# Patient Record
Sex: Male | Born: 1940 | Race: White | Hispanic: No | Smoking: Former smoker
Health system: Southern US, Community
[De-identification: ages and names within clinical notes are randomized; demographics above are authoritative.]

## PROBLEM LIST (undated history)

## (undated) DIAGNOSIS — C801 Malignant (primary) neoplasm, unspecified: Secondary | ICD-10-CM

## (undated) DIAGNOSIS — G8929 Other chronic pain: Secondary | ICD-10-CM

## (undated) DIAGNOSIS — K219 Gastro-esophageal reflux disease without esophagitis: Secondary | ICD-10-CM

## (undated) DIAGNOSIS — M549 Dorsalgia, unspecified: Secondary | ICD-10-CM

## (undated) DIAGNOSIS — I10 Essential (primary) hypertension: Secondary | ICD-10-CM

## (undated) DIAGNOSIS — J449 Chronic obstructive pulmonary disease, unspecified: Secondary | ICD-10-CM

## (undated) DIAGNOSIS — F1010 Alcohol abuse, uncomplicated: Secondary | ICD-10-CM

## (undated) DIAGNOSIS — E7800 Pure hypercholesterolemia, unspecified: Secondary | ICD-10-CM

## (undated) DIAGNOSIS — K2270 Barrett's esophagus without dysplasia: Secondary | ICD-10-CM

## (undated) DIAGNOSIS — G4730 Sleep apnea, unspecified: Secondary | ICD-10-CM

## (undated) DIAGNOSIS — F419 Anxiety disorder, unspecified: Secondary | ICD-10-CM

## (undated) HISTORY — PX: LASIK: SHX215

## (undated) HISTORY — PX: GANGLION CYST EXCISION: SHX1691

## (undated) HISTORY — PX: APPENDECTOMY: SHX54

## (undated) HISTORY — PX: EYE SURGERY: SHX253

## (undated) HISTORY — PX: CARDIAC CATHETERIZATION: SHX172

## (undated) HISTORY — PX: COLONOSCOPY: SHX174

## (undated) HISTORY — PX: TONSILLECTOMY: SUR1361

## (undated) HISTORY — PX: PROSTATECTOMY: SHX69

---

## 2018-01-29 DIAGNOSIS — E871 Hypo-osmolality and hyponatremia: Secondary | ICD-10-CM | POA: Insufficient documentation

## 2018-01-29 DIAGNOSIS — I517 Cardiomegaly: Secondary | ICD-10-CM | POA: Insufficient documentation

## 2018-01-29 DIAGNOSIS — R079 Chest pain, unspecified: Secondary | ICD-10-CM | POA: Insufficient documentation

## 2018-01-29 DIAGNOSIS — R9431 Abnormal electrocardiogram [ECG] [EKG]: Secondary | ICD-10-CM | POA: Insufficient documentation

## 2018-04-18 ENCOUNTER — Encounter (INDEPENDENT_AMBULATORY_CARE_PROVIDER_SITE_OTHER): Payer: Self-pay | Admitting: Orthopaedic Surgery

## 2018-04-18 ENCOUNTER — Ambulatory Visit (INDEPENDENT_AMBULATORY_CARE_PROVIDER_SITE_OTHER): Payer: Medicare Other | Admitting: Orthopaedic Surgery

## 2018-04-18 DIAGNOSIS — G8929 Other chronic pain: Secondary | ICD-10-CM | POA: Diagnosis not present

## 2018-04-18 DIAGNOSIS — M545 Low back pain, unspecified: Secondary | ICD-10-CM | POA: Insufficient documentation

## 2018-04-18 NOTE — Progress Notes (Signed)
Office Visit Note   Patient: Ricky Zavala           Date of Birth: [birth year]           MRN: [id number] Visit Date: 04/18/2018              Requested by: No referring provider defined for this encounter. PCP: Egbert Garibaldi, PA-C   Assessment & Plan: Visit Diagnoses:  1. Chronic bilateral low back pain without sciatica     Plan: I do feel that his back pain is more from stenosis and facet joint arthritis.  I do feel he would benefit from steroid injections in the facet joints likely at L4-L5 bilaterally but we need to obtain an MRI to best determine the level of where an intervention can help him the most.  In the meantime I will put him on a Medrol Dosepak while we await the MRI.  All questions concerns were answered and addressed.  Follow-Up Instructions: Return in about 2 weeks (around 05/02/2018).   Orders:  No orders of the defined types were placed in this encounter.  No orders of the defined types were placed in this encounter.     Procedures: No procedures performed   Clinical Data: No additional findings.   Subjective: Chief Complaint  Patient presents with  . Left Hip - Pain  The patient is someone I am seeing for the first time.  He actually comes in for evaluation of chronic low back pain.  He is actually seen a chiropractor for years now and has adjustments quite a bit.  He denies any groin pain.  Denies any radicular symptoms.  He denies any weakness in his legs.  He has had back pain for 4 years now.  He takes meloxicam on occasion to help with his pain.  Is gotten to where it hurts quite a bit on a daily basis across the lower aspect of his lumbar spine is where he points to.  He denies any change in bowel bladder function.  He is not a diabetic and not a smoker.  He currently denies any headache, chest pain, shortness of breath, fever, chills, nausea, vomiting.  HPI  Review of Systems See above  Objective: Vital Signs: There were no vitals taken for this  visit.  Physical Exam Is alert and oriented x3 and in no acute distress examination of his lumbar spine shows limited flexion extension with more extension and flexion.  His hamstrings are very tight. Ortho Exam He has 5 out of 5 strength of his bilateral lower extremities and normal sensation in his legs.  He hurts to the lower aspect of his lumbar spine to palpation of the paraspinal muscles in the midline. Specialty Comments:  No specialty comments available.  Imaging: No results found. X-rays that accompany him of the lumbar spine show no acute findings.  The lateral view does show a normal lordosis with some osteophytes at several levels and slight disc space narrowing.  PMFS History: Patient Active Problem List   Diagnosis Date Noted  . Chronic bilateral low back pain without sciatica 04/18/2018   History reviewed. No pertinent past medical history.  History reviewed. No pertinent family history.  History reviewed. No pertinent surgical history. Social History   Occupational History  . Not on file  Tobacco Use  . Smoking status: Not on file  Substance and Sexual Activity  . Alcohol use: Not on file  . Drug use: Not on file  . Sexual activity: Not  on file

## 2018-04-20 ENCOUNTER — Other Ambulatory Visit (INDEPENDENT_AMBULATORY_CARE_PROVIDER_SITE_OTHER): Payer: Self-pay

## 2018-04-20 ENCOUNTER — Telehealth (INDEPENDENT_AMBULATORY_CARE_PROVIDER_SITE_OTHER): Payer: Self-pay | Admitting: Orthopaedic Surgery

## 2018-04-20 DIAGNOSIS — M4807 Spinal stenosis, lumbosacral region: Secondary | ICD-10-CM

## 2018-04-20 MED ORDER — METHYLPREDNISOLONE 4 MG PO TABS: ORAL_TABLET | ORAL | 0 refills | Status: DC

## 2018-04-20 NOTE — Telephone Encounter (Signed)
Called in for patient.

## 2018-04-20 NOTE — Telephone Encounter (Signed)
Patient called and stated that he was told at his appt on 7/31 they would call in some steroids. There hasn't been any called in thus far.  Per visit notes:  I do feel he would benefit from steroid injections in the facet joints likely at L4-L5 bilaterally but we need to obtain an MRI to best determine the level of where an intervention can help him the most.  In the meantime I will put him on a Medrol Dosepak while we await the MRI.   Please call patient to advise. [phone]

## 2018-05-01 ENCOUNTER — Ambulatory Visit (INDEPENDENT_AMBULATORY_CARE_PROVIDER_SITE_OTHER): Payer: Medicare Other | Admitting: Physician Assistant

## 2018-05-02 ENCOUNTER — Other Ambulatory Visit: Payer: Self-pay

## 2018-05-02 ENCOUNTER — Ambulatory Visit
Admission: RE | Admit: 2018-05-02 | Discharge: 2018-05-02 | Disposition: A | Discharge status: Home / Self Care | Payer: Medicare Other | Source: Ambulatory Visit | Attending: Orthopaedic Surgery | Admitting: Orthopaedic Surgery

## 2018-05-02 DIAGNOSIS — M4807 Spinal stenosis, lumbosacral region: Secondary | ICD-10-CM

## 2018-05-03 ENCOUNTER — Ambulatory Visit (INDEPENDENT_AMBULATORY_CARE_PROVIDER_SITE_OTHER): Payer: Medicare Other | Admitting: Physician Assistant

## 2018-05-03 ENCOUNTER — Encounter (INDEPENDENT_AMBULATORY_CARE_PROVIDER_SITE_OTHER): Payer: Self-pay | Admitting: Physician Assistant

## 2018-05-03 DIAGNOSIS — M545 Low back pain, unspecified: Secondary | ICD-10-CM

## 2018-05-03 NOTE — Progress Notes (Signed)
Office Visit Note   Patient: Ricky Zavala           Date of Birth: 08/15/1941           MRN: [id number] Visit Date: 05/03/2018              Requested by: Egbert Garibaldi, PA-C [address] [place], [place] [address] PCP: Egbert Garibaldi, PA-C   Assessment & Plan: Visit Diagnoses: No diagnosis found.  Plan: We will send patient for lumbar epidural steroid injection versus facet injections Dr. Ernestina Patches.  Also send him to physical therapy for home exercise program to strengthen his core and also for stretching hamstrings gastrocsoleus.  Follow-Up Instructions: Return for 4 weeks after ESI.   Orders:  No orders of the defined types were placed in this encounter.  No orders of the defined types were placed in this encounter.     Procedures: No procedures performed   Clinical Data: No additional findings.   Subjective: Chief Complaint  Patient presents with  . Lower Back - Follow-up    HPI Mr. Overholser returns today to go over the MRI of his lumbar spine.  He denies any numbness tingling pain down either leg.  He just has low back pain worse with prolonged standing.  No waking pain.  He notes he has plantar fasciitis which he is treated for in [facility] by podiatrist.  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  This contributes to mild to moderate bilateral foraminal stenosis.  Spondylosis at L3-4 and L4-5 resulting in borderline bilateral subarticular recess stenosis.  Review of Systems See HPI otherwise negative  Objective: Vital Signs: There were no vitals taken for this visit.  Physical Exam  Constitutional: He is oriented to person, place, and time. He appears well-developed and well-nourished. No distress.  Pulmonary/Chest: Effort normal.  Neurological: He is alert and oriented to person, place, and time.  Skin: He is not diaphoretic.    Ortho Exam Negative straight leg raise  bilaterally.  Exquisitely tight hamstrings bilaterally.  Tight gastroc on the right. Specialty Comments:  No specialty comments available.  Imaging: Mr Lumbar Spine W/o Contrast  Result Date: 05/02/2018 CLINICAL DATA:  Low back pain and bilateral leg pain and weakness. Bilateral hip pain. EXAM: MRI LUMBAR SPINE WITHOUT CONTRAST TECHNIQUE: Multiplanar, multisequence MR imaging of the lumbar spine was performed. No intravenous contrast was administered. COMPARISON:  None. FINDINGS: Segmentation: The lowest lumbar type non-rib-bearing vertebra is labeled as L5. Alignment: 7 mm of anterolisthesis at L5-S1 associated with chronic bilateral pars defects. 4 mm degenerative retrolisthesis at L2-3 with 2 mm degenerative retrolisthesis L3-4. Vertebrae: Marrow heterogeneity is present. Although this can be caused by marrow infiltrative processes, the most common causes include anemia, smoking, obesity, or advancing age. 1.5 cm in diameter hemangioma in the T12 vertebral body. Type 1 degenerative endplate findings along the left inferior endplate of L4. Type 2 degenerative endplate findings at L4-T6. Conus medullaris and cauda equina: Conus extends to the L1 level. Conus and cauda equina appear normal. Paraspinal and other soft tissues: Fluid signal intensity lesions of the left kidney including what is probably a larger lesion along the lower pole. These may well be cysts better technically nonspecific and only partially included in imaging. No pathologic retroperitoneal adenopathy is observed. No appreciable abdominal aortic aneurysm. Disc levels: L1-2: No impingement.  Mild disc bulge. L2-3: No impingement.  Diffuse disc  bulge. L3-4: Borderline bilateral subarticular lateral recess stenosis due to disc bulge and facet arthropathy. L4-5: Borderline right subarticular lateral recess stenosis due to disc bulge. L5-S1: Mild to moderate bilateral foraminal stenosis due to disc uncovering and subluxation along with mild  facet arthropathy. This is slightly worse on the right compared to the left. IMPRESSION: 1. 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 2. Borderline impingement at L3-4 and L4-5 due to spondylosis and degenerative disc disease. 3. Marrow heterogeneity is present. Although this can be caused by marrow infiltrative processes, the most common causes include anemia, smoking, obesity, or advancing age. 4. We image the margin of several fluid signal intensity lesions in the vicinity of the left kidney. These are probably cysts but only a small minority of the volume of these lesions is included on today's imaging. Electronically Signed   By: Van Clines M.D.   On: 05/02/2018 16:55     PMFS History: Patient Active Problem List   Diagnosis Date Noted  . Chronic bilateral low back pain without sciatica 04/18/2018   No past medical history on file.  No family history on file.  No past surgical history on file. Social History   Occupational History  . Not on file  Tobacco Use  . Smoking status: Not on file  Substance and Sexual Activity  . Alcohol use: Not on file  . Drug use: Not on file  . Sexual activity: Not on file

## 2018-05-04 ENCOUNTER — Other Ambulatory Visit (INDEPENDENT_AMBULATORY_CARE_PROVIDER_SITE_OTHER): Payer: Self-pay

## 2018-05-04 DIAGNOSIS — G8929 Other chronic pain: Secondary | ICD-10-CM

## 2018-05-04 DIAGNOSIS — M545 Low back pain, unspecified: Secondary | ICD-10-CM

## 2018-05-07 DIAGNOSIS — R0609 Other forms of dyspnea: Secondary | ICD-10-CM | POA: Insufficient documentation

## 2018-05-25 ENCOUNTER — Ambulatory Visit (INDEPENDENT_AMBULATORY_CARE_PROVIDER_SITE_OTHER): Payer: Medicare Other | Admitting: Physical Medicine and Rehabilitation

## 2018-05-25 ENCOUNTER — Ambulatory Visit (INDEPENDENT_AMBULATORY_CARE_PROVIDER_SITE_OTHER): Payer: Self-pay

## 2018-05-25 ENCOUNTER — Encounter (INDEPENDENT_AMBULATORY_CARE_PROVIDER_SITE_OTHER): Payer: Self-pay | Admitting: Physical Medicine and Rehabilitation

## 2018-05-25 VITALS — BP 129/63 | HR 64

## 2018-05-25 DIAGNOSIS — M47816 Spondylosis without myelopathy or radiculopathy, lumbar region: Secondary | ICD-10-CM | POA: Diagnosis not present

## 2018-05-25 MED ORDER — METHYLPREDNISOLONE ACETATE 80 MG/ML IJ SUSP
80.0000 mg | Freq: Once | INTRAMUSCULAR | Status: AC
  Administered 2018-05-25: 80 mg

## 2018-05-25 NOTE — Patient Instructions (Signed)

## 2018-05-25 NOTE — Progress Notes (Signed)
 .  Numeric Pain Rating Scale and Functional Assessment Average Pain 6   In the last MONTH (on 0-10 scale) has pain interfered with the following?  1. General activity like being  able to carry out your everyday physical activities such as walking, climbing stairs, carrying groceries, or moving a chair?  Rating(4)   +Driver, -BT, -Dye Allergies.  

## 2018-06-13 NOTE — Progress Notes (Addendum)
Ricky Zavala - 77 y.o. male MRN [id number]  Date of birth: 1941-02-16  Office Visit Note: Visit Date: 05/25/2018 PCP: Ricky Garibaldi, PA-C Referred by: Ricky Garibaldi, PA-C  Subjective: Chief Complaint  Patient presents with  . Lower Back - Pain  . Right Hip - Pain  . Left Hip - Pain   HPI: Mr. Ricky Zavala is a 77 year old gentleman with chronic worsening bilateral lower back pain with some referral to the lateral hips.  He has no radicular pain or paresthesia.  He is been followed closely by Dr. Jean Zavala and Ricky Zavala, P.A.-C.  After failure of conservative care with medication management and activity modification MRI of the lower back was performed.  This does show facet arthropathy as well as pars defects at L5-S1 and listhesis.  Patient has some by foraminal narrowing but no central canal stenosis.  After discussion with him we are going to complete bilateral L4-5 and L5-S1 facet joint blocks.  If he does well with that we would consider second diagnostic block and possibly radiofrequency ablation.  He was also started with physical therapy for core strengthening which is very appropriate.  **ADDENDUM Facet level blocked were L3-4 and L4-5   ROS Otherwise per HPI.  Assessment & Plan: Visit Diagnoses:  1. Spondylosis without myelopathy or radiculopathy, lumbar region     Plan: No additional findings.   Meds & Orders:  Meds ordered this encounter  Medications  . methylPREDNISolone acetate (DEPO-MEDROL) injection 80 mg    Orders Placed This Encounter  Procedures  . Facet Injection  . XR C-ARM NO REPORT    Follow-up: Return if symptoms worsen or fail to improve.   Procedures: No procedures performed  Lumbar Facet Joint Intra-Articular Injection(s) with Fluoroscopic Guidance  Patient: Ricky Zavala      Date of Birth: 1940-09-30 MRN: [id number] PCP: Ricky Garibaldi, PA-C      Visit Date: 05/25/2018   Universal Protocol:    Date/Time: 05/25/2018  Consent Given By: the  patient  Position: PRONE   Additional Comments: Vital signs were monitored before and after the procedure. Patient was prepped and draped in the usual sterile fashion. The correct patient, procedure, and site was verified.   Injection Procedure Details:  Procedure Site One Meds Administered:  Meds ordered this encounter  Medications  . methylPREDNISolone acetate (DEPO-MEDROL) injection 80 mg     Laterality: Bilateral  Location/Site:  L4-L5 L5-S1  Needle size: 22 guage  Needle type: Spinal  Needle Placement: Articular  Findings:  -Comments: Excellent flow of contrast producing a partial arthrogram.  Procedure Details: The fluoroscope beam is vertically oriented in AP, and the inferior recess is visualized beneath the lower pole of the inferior apophyseal process, which represents the target point for needle insertion. When direct visualization is difficult the target point is located at the medial projection of the vertebral pedicle. The region overlying each aforementioned target is locally anesthetized with a 1 to 2 ml. volume of 1% Lidocaine without Epinephrine.   The spinal needle was inserted into each of the above mentioned facet joints using biplanar fluoroscopic guidance. A 0.25 to 0.5 ml. volume of Isovue-250 was injected and a partial facet joint arthrogram was obtained. A single spot film was obtained of the resulting arthrogram.    One to 1.25 ml of the steroid/anesthetic solution was then injected into each of the facet joints noted above.   Additional Comments:  The patient tolerated the procedure well Dressing: Band-Aid    Post-procedure details: Patient  was observed during the procedure. Post-procedure instructions were reviewed.  Patient left the clinic in stable condition.    Clinical History: MRI LUMBAR SPINE WITHOUT CONTRAST  TECHNIQUE: Multiplanar, multisequence MR imaging of the lumbar spine was performed. No intravenous contrast was  administered.  COMPARISON:  None.  FINDINGS: Segmentation: The lowest lumbar type non-rib-bearing vertebra is labeled as L5.  Alignment: 7 mm of anterolisthesis at L5-S1 associated with chronic bilateral pars defects.  4 mm degenerative retrolisthesis at L2-3 with 2 mm degenerative retrolisthesis L3-4.  Vertebrae: Marrow heterogeneity is present. Although this can be caused by marrow infiltrative processes, the most common causes include anemia, smoking, obesity, or advancing age.  1.5 cm in diameter hemangioma in the T12 vertebral body.  Type 1 degenerative endplate findings along the left inferior endplate of L4. Type 2 degenerative endplate findings at D9-I3.  Conus medullaris and cauda equina: Conus extends to the L1 level. Conus and cauda equina appear normal.  Paraspinal and other soft tissues: Fluid signal intensity lesions of the left kidney including what is probably a larger lesion along the lower pole. These may well be cysts better technically nonspecific and only partially included in imaging.  No pathologic retroperitoneal adenopathy is observed. No appreciable abdominal aortic aneurysm.  Disc levels:  L1-2: No impingement.  Mild disc bulge.  L2-3: No impingement.  Diffuse disc bulge.  L3-4: Borderline bilateral subarticular lateral recess stenosis due to disc bulge and facet arthropathy.  L4-5: Borderline right subarticular lateral recess stenosis due to disc bulge.  L5-S1: Mild to moderate bilateral foraminal stenosis due to disc uncovering and subluxation along with mild facet arthropathy. This is slightly worse on the right compared to the left.  IMPRESSION: 1. 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 2. Borderline impingement at L3-4 and L4-5 due to spondylosis and degenerative disc  disease. 3. Marrow heterogeneity is present. Although this can be caused by marrow infiltrative processes, the most common causes include anemia, smoking, obesity, or advancing age. 4. We image the margin of several fluid signal intensity lesions in the vicinity of the left kidney. These are probably cysts but only a small minority of the volume of these lesions is included on today's imaging.   Electronically Signed   By: Van Clines M.D.   On: 05/02/2018 16:55   He has no tobacco history on file. No results for input(s): HGBA1C, LABURIC in the last 8760 hours.  Objective:  VS:  HT:    WT:   BMI:     BP:129/63  HR:64bpm  TEMP: ( )  RESP:  Physical Exam  Ortho Exam Imaging: No results found.  Past Medical/Family/Surgical/Social History: Medications & Allergies reviewed per EMR, new medications updated. Patient Active Problem List   Diagnosis Date Noted  . Chronic bilateral low back pain without sciatica 04/18/2018   History reviewed. No pertinent past medical history. History reviewed. No pertinent family history. History reviewed. No pertinent surgical history. Social History   Occupational History  . Not on file  Tobacco Use  . Smoking status: Not on file  Substance and Sexual Activity  . Alcohol use: Not on file  . Drug use: Not on file  . Sexual activity: Not on file

## 2018-06-13 NOTE — Procedures (Signed)
Lumbar Facet Joint Intra-Articular Injection(s) with Fluoroscopic Guidance  Patient: Ricky Zavala      Date of Birth: [birth year] MRN: [id number] PCP: Egbert Garibaldi, PA-C      Visit Date: 05/25/2018   Universal Protocol:    Date/Time: 05/25/2018  Consent Given By: the patient  Position: PRONE   Additional Comments: Vital signs were monitored before and after the procedure. Patient was prepped and draped in the usual sterile fashion. The correct patient, procedure, and site was verified.   Injection Procedure Details:  Procedure Site One Meds Administered:  Meds ordered this encounter  Medications  . methylPREDNISolone acetate (DEPO-MEDROL) injection 80 mg     Laterality: Bilateral  Location/Site:  L4-L5 L5-S1  Needle size: 22 guage  Needle type: Spinal  Needle Placement: Articular  Findings:  -Comments: Excellent flow of contrast producing a partial arthrogram.  Procedure Details: The fluoroscope beam is vertically oriented in AP, and the inferior recess is visualized beneath the lower pole of the inferior apophyseal process, which represents the target point for needle insertion. When direct visualization is difficult the target point is located at the medial projection of the vertebral pedicle. The region overlying each aforementioned target is locally anesthetized with a 1 to 2 ml. volume of 1% Lidocaine without Epinephrine.   The spinal needle was inserted into each of the above mentioned facet joints using biplanar fluoroscopic guidance. A 0.25 to 0.5 ml. volume of Isovue-250 was injected and a partial facet joint arthrogram was obtained. A single spot film was obtained of the resulting arthrogram.    One to 1.25 ml of the steroid/anesthetic solution was then injected into each of the facet joints noted above.   Additional Comments:  The patient tolerated the procedure well Dressing: Band-Aid    Post-procedure details: Patient was observed during the  procedure. Post-procedure instructions were reviewed.  Patient left the clinic in stable condition.

## 2018-06-19 ENCOUNTER — Ambulatory Visit (INDEPENDENT_AMBULATORY_CARE_PROVIDER_SITE_OTHER): Payer: Medicare Other | Admitting: Physical Medicine and Rehabilitation

## 2018-06-19 ENCOUNTER — Encounter (INDEPENDENT_AMBULATORY_CARE_PROVIDER_SITE_OTHER): Payer: Self-pay | Admitting: Physical Medicine and Rehabilitation

## 2018-06-19 VITALS — BP 128/66 | HR 60 | Ht 72.0 in | Wt 215.0 lb

## 2018-06-19 DIAGNOSIS — G8929 Other chronic pain: Secondary | ICD-10-CM

## 2018-06-19 DIAGNOSIS — Q762 Congenital spondylolisthesis: Secondary | ICD-10-CM | POA: Diagnosis not present

## 2018-06-19 DIAGNOSIS — M47812 Spondylosis without myelopathy or radiculopathy, cervical region: Secondary | ICD-10-CM

## 2018-06-19 DIAGNOSIS — M7918 Myalgia, other site: Secondary | ICD-10-CM

## 2018-06-19 DIAGNOSIS — M542 Cervicalgia: Secondary | ICD-10-CM

## 2018-06-19 DIAGNOSIS — M545 Low back pain: Secondary | ICD-10-CM | POA: Diagnosis not present

## 2018-06-19 DIAGNOSIS — M47816 Spondylosis without myelopathy or radiculopathy, lumbar region: Secondary | ICD-10-CM | POA: Diagnosis not present

## 2018-06-19 NOTE — Progress Notes (Signed)
.  Numeric Pain Rating Scale and Functional Assessment Average Pain 8 Pain Right Now 3 My pain is constant and dull Pain is worse with: walking, bending and some activites Pain improves with: medication   In the last MONTH (on 0-10 scale) has pain interfered with the following?  1. General activity like being  able to carry out your everyday physical activities such as walking, climbing stairs, carrying groceries, or moving a chair?  Rating(4)  2. Relation with others like being able to carry out your usual social activities and roles such as  activities at home, at work and in your community. Rating(2)  3. Enjoyment of life such that you have  been bothered by emotional problems such as feeling anxious, depressed or irritable?  Rating(0)

## 2018-06-25 ENCOUNTER — Ambulatory Visit (INDEPENDENT_AMBULATORY_CARE_PROVIDER_SITE_OTHER): Payer: Self-pay

## 2018-06-25 ENCOUNTER — Encounter (INDEPENDENT_AMBULATORY_CARE_PROVIDER_SITE_OTHER): Payer: Self-pay | Admitting: Physical Medicine and Rehabilitation

## 2018-06-25 ENCOUNTER — Ambulatory Visit (INDEPENDENT_AMBULATORY_CARE_PROVIDER_SITE_OTHER): Payer: Medicare Other | Admitting: Physical Medicine and Rehabilitation

## 2018-06-25 VITALS — BP 154/75 | HR 60 | Temp 98.5°F

## 2018-06-25 DIAGNOSIS — M47816 Spondylosis without myelopathy or radiculopathy, lumbar region: Secondary | ICD-10-CM | POA: Diagnosis not present

## 2018-06-25 DIAGNOSIS — M545 Low back pain: Secondary | ICD-10-CM

## 2018-06-25 DIAGNOSIS — G8929 Other chronic pain: Secondary | ICD-10-CM

## 2018-06-25 MED ORDER — BUPIVACAINE HCL 0.5 % IJ SOLN: 3.0000 mL | Freq: Once | INTRAMUSCULAR | Status: DC

## 2018-06-25 MED ORDER — METHYLPREDNISOLONE ACETATE 80 MG/ML IJ SUSP: 80.0000 mg | Freq: Once | INTRAMUSCULAR | Status: DC

## 2018-06-25 NOTE — Patient Instructions (Signed)

## 2018-06-25 NOTE — Progress Notes (Signed)
 .  Numeric Pain Rating Scale and Functional Assessment Average Pain 2   In the last MONTH (on 0-10 scale) has pain interfered with the following?  1. General activity like being  able to carry out your everyday physical activities such as walking, climbing stairs, carrying groceries, or moving a chair?  Rating(2)   +Driver, -BT, -Dye Allergies.  

## 2018-07-10 ENCOUNTER — Encounter (INDEPENDENT_AMBULATORY_CARE_PROVIDER_SITE_OTHER): Payer: Self-pay | Admitting: Physical Medicine and Rehabilitation

## 2018-07-10 NOTE — Procedures (Signed)
Lumbar Diagnostic Facet Joint Nerve Block with Fluoroscopic Guidance   Patient: Ricky Zavala      Date of Birth: [birth year] MRN: [id number] PCP: Egbert Garibaldi, PA-C      Visit Date: 06/25/2018   Universal Protocol:    Date/Time: 10/22/196:09 AM  Consent Given By: the patient  Position: PRONE  Additional Comments: Vital signs were monitored before and after the procedure. Patient was prepped and draped in the usual sterile fashion. The correct patient, procedure, and site was verified.   Injection Procedure Details:  Procedure Site One Meds Administered:  Meds ordered this encounter  Medications  . bupivacaine (MARCAINE) 0.5 % (with pres) injection 3 mL  . methylPREDNISolone acetate (DEPO-MEDROL) injection 80 mg     Laterality: Bilateral  Location/Site:  L3-L4 L4-L5  Needle size: 22 ga.  Needle type:spinal  Needle Placement: Oblique pedical  Findings:   -Comments: There was excellent flow of contrast along the articular pillars without intravascular flow.  Procedure Details: The fluoroscope beam is vertically oriented in AP and then obliqued 15 to 20 degrees to the ipsilateral side of the desired nerve to achieve the "Scotty dog" appearance.  The skin over the target area of the junction of the superior articulating process and the transverse process (sacral ala if blocking the L5 dorsal rami) was locally anesthetized with a 1 ml volume of 1% Lidocaine without Epinephrine.  The spinal needle was inserted and advanced in a trajectory view down to the target.   After contact with periosteum and negative aspirate for blood and CSF, correct placement without intravascular or epidural spread was confirmed by injecting 0.5 ml. of Isovue-250.  A spot radiograph was obtained of this image.    Next, a 0.5 ml. volume of the injectate described above was injected. The needle was then redirected to the other facet joint nerves mentioned above if needed.  Prior to the procedure,  the patient was given a Pain Diary which was completed for baseline measurements.  After the procedure, the patient rated their pain every 30 minutes and will continue rating at this frequency for a total of 5 hours.  The patient has been asked to complete the Diary and return to [place] by mail, fax or hand delivered as soon as possible.   Additional Comments:   Dressing:     Post-procedure details: Patient was observed during the procedure. Post-procedure instructions were reviewed.  Patient left the clinic in stable condition.

## 2018-07-10 NOTE — Progress Notes (Signed)
Ricky Zavala - 77 y.o. male MRN [id number]  Date of birth: 1940-11-16  Office Visit Note: Visit Date: 06/25/2018 PCP: Egbert Garibaldi, PA-C Referred by: Egbert Garibaldi, PA-C  Subjective: Chief Complaint  Patient presents with  . Lower Back - Pain   HPI:  Ricky Zavala is a 77 y.o. male who comes in today For planned bilateral L3-4 and L4-5 facet joint block.  Please note that the prior diagnostic facet joint blocks were labeled as L4-5 and L5-S1 when in fact they were done at L3-4 L4-5 and this was a mistake in the dictation.  This carried over into the last note where we discussed L4-5 and L5-S1 but in point of fact imaging is more corresponded to the L3-4 and L4-5 level and he got almost 90% relief with prior blocks at L3-4 and L4-5.  We will repeat those today for double block paradigm using fluoroscopic guidance.  Goal would be radiofrequency ablation of the same joints.  ROS Otherwise per HPI.  Assessment & Plan: Visit Diagnoses:  1. Spondylosis without myelopathy or radiculopathy, lumbar region   2. Chronic bilateral low back pain without sciatica     Plan: No additional findings.   Meds & Orders:  Meds ordered this encounter  Medications  . bupivacaine (MARCAINE) 0.5 % (with pres) injection 3 mL  . methylPREDNISolone acetate (DEPO-MEDROL) injection 80 mg    Orders Placed This Encounter  Procedures  . Facet Injection  . XR C-ARM NO REPORT    Follow-up: Return if symptoms worsen or fail to improve.   Procedures: No procedures performed  Lumbar Diagnostic Facet Joint Nerve Block with Fluoroscopic Guidance   Patient: Ricky Zavala      Date of Birth: [birth year] MRN: [id number] PCP: Egbert Garibaldi, PA-C      Visit Date: 06/25/2018   Universal Protocol:    Date/Time: 10/22/196:09 AM  Consent Given By: the patient  Position: PRONE  Additional Comments: Vital signs were monitored before and after the procedure. Patient was prepped and draped in the usual sterile  fashion. The correct patient, procedure, and site was verified.   Injection Procedure Details:  Procedure Site One Meds Administered:  Meds ordered this encounter  Medications  . bupivacaine (MARCAINE) 0.5 % (with pres) injection 3 mL  . methylPREDNISolone acetate (DEPO-MEDROL) injection 80 mg     Laterality: Bilateral  Location/Site:  L3-L4 L4-L5  Needle size: 22 ga.  Needle type:spinal  Needle Placement: Oblique pedical  Findings:   -Comments: There was excellent flow of contrast along the articular pillars without intravascular flow.  Procedure Details: The fluoroscope beam is vertically oriented in AP and then obliqued 15 to 20 degrees to the ipsilateral side of the desired nerve to achieve the "Scotty dog" appearance.  The skin over the target area of the junction of the superior articulating process and the transverse process (sacral ala if blocking the L5 dorsal rami) was locally anesthetized with a 1 ml volume of 1% Lidocaine without Epinephrine.  The spinal needle was inserted and advanced in a trajectory view down to the target.   After contact with periosteum and negative aspirate for blood and CSF, correct placement without intravascular or epidural spread was confirmed by injecting 0.5 ml. of Isovue-250.  A spot radiograph was obtained of this image.    Next, a 0.5 ml. volume of the injectate described above was injected. The needle was then redirected to the other facet joint nerves mentioned above if needed.  Prior to the procedure, the  patient was given a Pain Diary which was completed for baseline measurements.  After the procedure, the patient rated their pain every 30 minutes and will continue rating at this frequency for a total of 5 hours.  The patient has been asked to complete the Diary and return to [place] by mail, fax or hand delivered as soon as possible.   Additional Comments:   Dressing:     Post-procedure details: Patient was observed during the  procedure. Post-procedure instructions were reviewed.  Patient left the clinic in stable condition.   Clinical History: MRI CERVICAL SPINE WITHOUT CONTRAST  TECHNIQUE: Multiplanar, multisequence MR imaging of the cervical spine was performed. No intravenous contrast was administered.  COMPARISON:None.  FINDINGS: Heterogeneous marrow without focal masslike lesion.  There is marrow edema within the right C5 inferior articular process with indistinct cortex on T1 weighted sagittal imaging. Anterior inferior corner edema also of C5, without discrete fracture line. There is mild prevertebral edema at C5 and C6 and extensive perispinous edema in the upper cervical spine. No major ligamentous deficiency or subluxation noted.  Normal cord signal and morphology.No evidence of canal collection.  No extra-spinal findings to explain symptoms. Normal flow related signal loss in the visible cervical and carotid arteries.  Degenerative changes:  C2-3: Facet arthropathy with bulky spurring on the right, causing mild moderate foraminal stenosis. Patent canal and left foramen.  C3-4: Facet arthropathy with bulky spurring on the right. Mild right foraminal stenosis. Patent canal and open left foramen.  C4-5: Disc narrowing and small uncovertebral spurs. Facet arthropathy with mild spurring. Mild left foraminal narrowing. Patent canal and right foramen.  C5-6: Posterior disc osteophyte complex with hyperintense annulus on T2 weighted imaging. Despite traumatic findings, this is not a definite acute disc injury. There is bilateral fairly bulky uncovertebral spurring. Facet arthropathy with mild overgrowth. Ventral thecal sac is effaced without cord compression. Bilateral foraminal stenosis which is advanced.  C6-7: Disc narrowing and endplate spurring with bulky right uncovertebral spur. Mild facet overgrowth. Right foraminal stenosis, advanced. Patent canal and left  foramen.  C7-T1:Disc narrowing without herniation. Negative facets. No impingement.  These results were called by telephone at the time of interpretation on 08/19/2015 at 2:02 pm to Dr. Marciano Sequin   MRI [place]: Multiplanar, multisequence MR imaging of the lumbar spine was performed. No intravenous contrast was administered.  COMPARISON: None.  FINDINGS: Segmentation: The lowest lumbar type non-rib-bearing vertebra is labeled as L5.  Alignment: 7 mm of anterolisthesis at L5-S1 associated with chronic bilateral pars defects.  4 mm degenerative retrolisthesis at L2-3 with 2 mm degenerative retrolisthesis L3-4.  Vertebrae: Marrow heterogeneity is present. Although this can be caused by marrow infiltrative processes, the most common causes include anemia, smoking, obesity, or advancing age.  1.5 cm in diameter hemangioma in the T12 vertebral body.  Type 1 degenerative endplate findings along the left inferior endplate of L4. Type 2 degenerative endplate findings at O7-F6.  Conus medullaris and cauda equina: Conus extends to the L1 level. Conus and cauda equina appear normal.  Paraspinal and other soft tissues: Fluid signal intensity lesions of the left kidney including what is probably a larger lesion along the lower pole. These may well be cysts better technically nonspecific and only partially included in imaging.  No pathologic retroperitoneal adenopathy is observed. No appreciable abdominal aortic aneurysm.  Disc levels:  L1-2: No impingement. Mild disc bulge.  L2-3: No impingement. Diffuse disc bulge.  L3-4: Borderline bilateral subarticular lateral  recess stenosis due to disc bulge and facet arthropathy.  L4-5: Borderline right subarticular lateral recess stenosis due to disc bulge.  L5-S1: Mild to moderate bilateral foraminal stenosis due to disc uncovering and subluxation along with mild facet  arthropathy. This is slightly worse on the right compared to the left.  IMPRESSION: 1. 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 2. Borderline impingement at L3-4 and L4-5 due to spondylosis and degenerative disc disease. 3. Marrow heterogeneity is present. Although this can be caused by marrow infiltrative processes, the most common causes include anemia, smoking, obesity, or advancing age. 4. We image the margin of several fluid signal intensity lesions in the vicinity of the left kidney. These are probably cysts but only a small minority of the volume of these lesions is included on today's imaging.   Electronically Signed By: Van Clines M.D. On: 05/02/2018 16:55     Objective:  VS:  HT:    WT:   BMI:     BP:(!) 154/75  HR:60bpm  TEMP:98.5 F (36.9 C)(Oral)  RESP:  Physical Exam  Ortho Exam Imaging: No results found.

## 2018-07-10 NOTE — Progress Notes (Signed)
Ricky Zavala - 77 y.o. male MRN [id number]  Date of birth: 05/17/1941  Office Visit Note: Visit Date: 06/19/2018 PCP: Egbert Garibaldi, PA-C Referred by: Egbert Garibaldi, PA-C  Subjective: Chief Complaint  Patient presents with  . Lower Back - Pain, Weakness  . Neck - Pain   HPI: Ricky Zavala is a 77 y.o. male who comes in today For reevaluation of his chronic long-term axial back pain but as well as new evaluation for cervical neck pain.  He is followed by Dr. Jean Rosenthal for his orthopedic complaints and was initially sent to me and we completed diagnostic facet joint blocks of the bilateral L4-5 and L5-S1 facet joint blocks.  He reports relief from the facet joint blocks were probably 90% of his low back pain and at least for 2 weeks he was doing quite well and the pain started to slowly return.  Unfortunately the pain has returned now fully and he rates his pain is 8 out of 10 at this point.  He still denies any radicular pain down the legs or focal weakness.  No new trauma no red flag complaints as noted in the prior notes of Dr. Ninfa Linden and myself.  At the first office visit he did start talking about his neck pain and wanted to be seen by me for that which I agreed to [place] for like Dr. Ninfa Linden would mind if we saw him for his neck.  Unfortunately his back pain is really a lot worse than his neck pain at this point he still wants to focus on the back pain which I think is appropriate.  He reports worsening with walking and standing.  Medications have not been very helpful.  The diagnostic blocks are very helpful and we did include a small bit of dexamethasone which gave him a little bit more lasting effect.  He has had physical therapy as well as activity modification.  He reports constant and dull pain again worse with walking and going from sit to stand.  In terms of his neck pain we did evaluate him today.  He reports that his neck is been hurting for many years but worsens specifically  in 2016 he had a fall in the middle of November with left-sided radicular type pain in the arm.  He was being seen at that time by [facility] pain management and they did obtain MRI of the cervical spine which is reviewed below.  He did have paraspinal edema and some edema in the spinous processes and lateral mass of C5.  This was felt to be may be a nondisplaced fracture.  There were areas of foraminal narrowing and mild central stenosis but no frank nerve compression.  He did have a lot of arthritic changes as well.  He actually was seen at the [facility] and this was felt to be something that would resolve on its own not requiring surgery.  He reports pain with trying to turn his neck to the left more than right and pain with extension.  He denies any radicular pain down the arms.  No numbness tingling or paresthesia.  Review of Systems  Constitutional: Negative for chills, fever, malaise/fatigue and weight loss.  HENT: Negative for hearing loss and sinus pain.   Eyes: Negative for blurred vision, double vision and photophobia.  Respiratory: Negative for cough and shortness of breath.   Cardiovascular: Negative for chest pain, palpitations and leg swelling.  Gastrointestinal: Negative for abdominal pain, nausea and vomiting.  Genitourinary: Negative for flank pain.  Musculoskeletal: Positive for back pain, joint pain and neck pain. Negative for myalgias.  Skin: Negative for itching and rash.  Neurological: Negative for tremors, focal weakness and weakness.  Endo/Heme/Allergies: Negative.   Psychiatric/Behavioral: Negative for depression.  All other systems reviewed and are negative.  Otherwise per HPI.  Assessment & Plan: Visit Diagnoses:  1. Spondylosis without myelopathy or radiculopathy, lumbar region   2. Chronic bilateral low back pain without sciatica   3. Congenital spondylolysis   4. Cervicalgia   5. Cervical spondylosis without myelopathy   6.  Myofascial pain syndrome     Plan: Findings:  1.  Chronic recalcitrant long-term axial back pain felt to be facet joint mediated back pain due to clinical history as well as exam and now diagnostic medial branch blocks.  The next step is to repeat the medial branch blocks and a double block paradigm and if he does well we will look at radiofrequency ablation.  Lumbar spine MRI was reviewed again today and the patient said no new issues.  He got really great relief with the prior block upwards of 90% relief.  2.  In terms of his neck pain this is been a chronic issue for some time worsened in 2016 after a fall.  He had right lateral mass nondisplaced fracture potentially although it was just mainly edema noted at C5.  No focal nerve compression or significant stenosis.  Degenerative changes at C5-6 which is common.  Exam today briefly shows pain with extension rotation could be facet mediated pain here as well.  Does have some myofascial pain history of some anxiety does take lorazepam.  We talked about stretching and strengthening of the cervical spine.  May do well with a regrouping with a physical therapist for dry needling.  Would consider interventional spine procedure if not getting better but his back pain is worse at this point.  No red flag symptoms.    Meds & Orders: No orders of the defined types were placed in this encounter.  No orders of the defined types were placed in this encounter.   Follow-up: Return for Repeat and a double block paradigm of bilateral facet joint blocks at L4-5 and L5-S1.   Procedures: No procedures performed  No notes on file   Clinical History: MRI CERVICAL SPINE WITHOUT CONTRAST  TECHNIQUE: Multiplanar, multisequence MR imaging of the cervical spine was performed. No intravenous contrast was administered.  COMPARISON:None.  FINDINGS: Heterogeneous marrow without focal masslike lesion.  There is marrow edema within the right C5 inferior articular  process with indistinct cortex on T1 weighted sagittal imaging. Anterior inferior corner edema also of C5, without discrete fracture line. There is mild prevertebral edema at C5 and C6 and extensive perispinous edema in the upper cervical spine. No major ligamentous deficiency or subluxation noted.  Normal cord signal and morphology.No evidence of canal collection.  No extra-spinal findings to explain symptoms. Normal flow related signal loss in the visible cervical and carotid arteries.  Degenerative changes:  C2-3: Facet arthropathy with bulky spurring on the right, causing mild moderate foraminal stenosis. Patent canal and left foramen.  C3-4: Facet arthropathy with bulky spurring on the right. Mild right foraminal stenosis. Patent canal and open left foramen.  C4-5: Disc narrowing and small uncovertebral spurs. Facet arthropathy with mild spurring. Mild left foraminal narrowing. Patent canal and right foramen.  C5-6: Posterior disc osteophyte complex with hyperintense annulus on T2 weighted imaging. Despite traumatic findings, this is not  a definite acute disc injury. There is bilateral fairly bulky uncovertebral spurring. Facet arthropathy with mild overgrowth. Ventral thecal sac is effaced without cord compression. Bilateral foraminal stenosis which is advanced.  C6-7: Disc narrowing and endplate spurring with bulky right uncovertebral spur. Mild facet overgrowth. Right foraminal stenosis, advanced. Patent canal and left foramen.  C7-T1:Disc narrowing without herniation. Negative facets. No impingement.  These results were called by telephone at the time of interpretation on 08/19/2015 at 2:02 pm to Dr. Marciano Sequin   MRI [place]: Multiplanar, multisequence MR imaging of the lumbar spine was performed. No intravenous contrast was administered.  COMPARISON: None.  FINDINGS: Segmentation: The lowest lumbar type  non-rib-bearing vertebra is labeled as L5.  Alignment: 7 mm of anterolisthesis at L5-S1 associated with chronic bilateral pars defects.  4 mm degenerative retrolisthesis at L2-3 with 2 mm degenerative retrolisthesis L3-4.  Vertebrae: Marrow heterogeneity is present. Although this can be caused by marrow infiltrative processes, the most common causes include anemia, smoking, obesity, or advancing age.  1.5 cm in diameter hemangioma in the T12 vertebral body.  Type 1 degenerative endplate findings along the left inferior endplate of L4. Type 2 degenerative endplate findings at P5-K9.  Conus medullaris and cauda equina: Conus extends to the L1 level. Conus and cauda equina appear normal.  Paraspinal and other soft tissues: Fluid signal intensity lesions of the left kidney including what is probably a larger lesion along the lower pole. These may well be cysts better technically nonspecific and only partially included in imaging.  No pathologic retroperitoneal adenopathy is observed. No appreciable abdominal aortic aneurysm.  Disc levels:  L1-2: No impingement. Mild disc bulge.  L2-3: No impingement. Diffuse disc bulge.  L3-4: Borderline bilateral subarticular lateral recess stenosis due to disc bulge and facet arthropathy.  L4-5: Borderline right subarticular lateral recess stenosis due to disc bulge.  L5-S1: Mild to moderate bilateral foraminal stenosis due to disc uncovering and subluxation along with mild facet arthropathy. This is slightly worse on the right compared to the left.  IMPRESSION: 1. 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 2. Borderline impingement at L3-4 and L4-5 due to spondylosis and degenerative disc disease. 3. Marrow heterogeneity is present. Although this can be caused by marrow infiltrative  processes, the most common causes include anemia, smoking, obesity, or advancing age. 4. We image the margin of several fluid signal intensity lesions in the vicinity of the left kidney. These are probably cysts but only a small minority of the volume of these lesions is included on today's imaging.   Electronically Signed By: Van Clines M.D. On: 05/02/2018 16:55   He reports that he has quit smoking. He has never used smokeless tobacco. No results for input(s): HGBA1C, LABURIC in the last 8760 hours.  Objective:  VS:  HT:6' (182.9 cm)   WT:215 lb (97.5 kg)  BMI:29.15    BP:128/66  HR:60bpm  TEMP: ( )  RESP:  Physical Exam  Constitutional: He is oriented to person, place, and time. He appears well-developed and well-nourished. No distress.  HENT:  Head: Normocephalic and atraumatic.  Nose: Nose normal.  Mouth/Throat: Oropharynx is clear and moist.  Eyes: Pupils are equal, round, and reactive to light. Conjunctivae are normal.  Neck: Normal range of motion. Neck supple. No tracheal deviation present.  Cardiovascular: Regular rhythm and intact distal pulses.  Pulmonary/Chest: Effort  normal and breath sounds normal.  Abdominal: Soft. He exhibits no distension. There is no rebound and no guarding.  Musculoskeletal: He exhibits no deformity.  Examination of the lumbar spine shows forward flexed lumbar spine pain with extension and concordant pain with facet joint loading.  He has no trigger points are taut bands of the lower paraspinal musculature no pain over the PSIS or greater trochanters.  No pain with hip rotation.  Distal strength without clonus.  In terms of his neck he has forward flexed cervical spine pain with extension rotation and facet loading here as well.  Some trigger points in the levator scapula and trapezius.  Negative Spurling's test negative Hoffman's test with good strength bilaterally in upper extremities.  Mild impingement of the right shoulder on exam  more than the left.  Neurological: He is alert and oriented to person, place, and time. He exhibits normal muscle tone. Coordination normal.  Skin: Skin is warm. No rash noted.  Psychiatric: He has a normal mood and affect. His behavior is normal.  Nursing note and vitals reviewed.   Ortho Exam Imaging: No results found.  Past Medical/Family/Surgical/Social History: Medications & Allergies reviewed per EMR, new medications updated. Patient Active Problem List   Diagnosis Date Noted  . Chronic bilateral low back pain without sciatica 04/18/2018   History reviewed. No pertinent past medical history. History reviewed. No pertinent family history. History reviewed. No pertinent surgical history. Social History   Occupational History  . Not on file  Tobacco Use  . Smoking status: Former Research scientist (life sciences)  . Smokeless tobacco: Never Used  Substance and Sexual Activity  . Alcohol use: Not Currently  . Drug use: Not Currently  . Sexual activity: Not on file

## 2018-07-17 ENCOUNTER — Ambulatory Visit (INDEPENDENT_AMBULATORY_CARE_PROVIDER_SITE_OTHER): Payer: Self-pay | Admitting: Physical Medicine and Rehabilitation

## 2018-07-31 ENCOUNTER — Ambulatory Visit (INDEPENDENT_AMBULATORY_CARE_PROVIDER_SITE_OTHER): Payer: Medicare Other | Admitting: Physical Medicine and Rehabilitation

## 2018-07-31 ENCOUNTER — Ambulatory Visit (INDEPENDENT_AMBULATORY_CARE_PROVIDER_SITE_OTHER): Payer: Self-pay

## 2018-07-31 ENCOUNTER — Encounter (INDEPENDENT_AMBULATORY_CARE_PROVIDER_SITE_OTHER): Payer: Self-pay | Admitting: Physical Medicine and Rehabilitation

## 2018-07-31 VITALS — BP 143/64 | HR 61 | Ht 72.0 in | Wt 212.0 lb

## 2018-07-31 DIAGNOSIS — M542 Cervicalgia: Secondary | ICD-10-CM

## 2018-07-31 DIAGNOSIS — M47812 Spondylosis without myelopathy or radiculopathy, cervical region: Secondary | ICD-10-CM

## 2018-07-31 DIAGNOSIS — Q762 Congenital spondylolisthesis: Secondary | ICD-10-CM

## 2018-07-31 DIAGNOSIS — M47816 Spondylosis without myelopathy or radiculopathy, lumbar region: Secondary | ICD-10-CM

## 2018-07-31 NOTE — Progress Notes (Signed)
.  Numeric Pain Rating Scale and Functional Assessment Average Pain 4 Pain Right Now 4 My pain is intermittent, dull and aching Pain is worse with: some activites Pain improves with: medication   In the last MONTH (on 0-10 scale) has pain interfered with the following?  1. General activity like being  able to carry out your everyday physical activities such as walking, climbing stairs, carrying groceries, or moving a chair?  Rating(5)  2. Relation with others like being able to carry out your usual social activities and roles such as  activities at home, at work and in your community. Rating(5)  3. Enjoyment of life such that you have  been bothered by emotional problems such as feeling anxious, depressed or irritable?  Rating(2)

## 2018-08-01 ENCOUNTER — Telehealth (INDEPENDENT_AMBULATORY_CARE_PROVIDER_SITE_OTHER): Payer: Self-pay | Admitting: *Deleted

## 2018-08-01 NOTE — Telephone Encounter (Signed)
Stanton Kidney for [facility] states For [id number] and [phone] No PA is needed. Reference # [id number]

## 2018-08-09 ENCOUNTER — Encounter (INDEPENDENT_AMBULATORY_CARE_PROVIDER_SITE_OTHER): Payer: Self-pay | Admitting: Physical Medicine and Rehabilitation

## 2018-08-09 ENCOUNTER — Ambulatory Visit (INDEPENDENT_AMBULATORY_CARE_PROVIDER_SITE_OTHER): Payer: Medicare Other | Admitting: Physical Medicine and Rehabilitation

## 2018-08-09 ENCOUNTER — Ambulatory Visit (INDEPENDENT_AMBULATORY_CARE_PROVIDER_SITE_OTHER): Payer: Self-pay

## 2018-08-09 VITALS — BP 155/79 | HR 60 | Temp 98.4°F

## 2018-08-09 DIAGNOSIS — M542 Cervicalgia: Secondary | ICD-10-CM | POA: Diagnosis not present

## 2018-08-09 DIAGNOSIS — M47812 Spondylosis without myelopathy or radiculopathy, cervical region: Secondary | ICD-10-CM

## 2018-08-09 MED ORDER — METHYLPREDNISOLONE ACETATE 80 MG/ML IJ SUSP
80.0000 mg | Freq: Once | INTRAMUSCULAR | Status: AC
  Administered 2018-08-09: 80 mg

## 2018-08-09 MED ORDER — BUPIVACAINE HCL 0.25 % IJ SOLN
2.0000 mL | Freq: Once | INTRAMUSCULAR | Status: AC
  Administered 2018-08-09: 2 mL

## 2018-08-09 NOTE — Progress Notes (Signed)
 .  Numeric Pain Rating Scale and Functional Assessment Average Pain 5   In the last MONTH (on 0-10 scale) has pain interfered with the following?  1. General activity like being  able to carry out your everyday physical activities such as walking, climbing stairs, carrying groceries, or moving a chair?  Rating(3)   +Driver, -BT, -Dye Allergies.  

## 2018-08-09 NOTE — Patient Instructions (Signed)

## 2018-08-10 ENCOUNTER — Encounter (INDEPENDENT_AMBULATORY_CARE_PROVIDER_SITE_OTHER): Payer: Self-pay | Admitting: Physical Medicine and Rehabilitation

## 2018-08-10 ENCOUNTER — Telehealth (INDEPENDENT_AMBULATORY_CARE_PROVIDER_SITE_OTHER): Payer: Self-pay | Admitting: *Deleted

## 2018-08-10 DIAGNOSIS — Q762 Congenital spondylolisthesis: Secondary | ICD-10-CM | POA: Insufficient documentation

## 2018-08-10 DIAGNOSIS — M47816 Spondylosis without myelopathy or radiculopathy, lumbar region: Secondary | ICD-10-CM | POA: Insufficient documentation

## 2018-08-10 DIAGNOSIS — M47812 Spondylosis without myelopathy or radiculopathy, cervical region: Secondary | ICD-10-CM | POA: Insufficient documentation

## 2018-08-10 NOTE — Progress Notes (Signed)
Pt is scheduled for 09/03/18 and 09/20/18 for RFA with driver.

## 2018-08-10 NOTE — Telephone Encounter (Signed)
Notification or Prior Authorization is not required for the requested services  This [facility] [employer] members plan does not currently require a prior authorization for these services [id number] and [phone]  Decision ID #:[id number]

## 2018-08-10 NOTE — Progress Notes (Signed)
Ricky Zavala - 77 y.o. male MRN [id number]  Date of birth: December 22, 1940  Office Visit Note: Visit Date: 07/31/2018 PCP: Egbert Garibaldi, PA-C Referred by: Egbert Garibaldi, PA-C  Subjective: Chief Complaint  Patient presents with  . Neck - Pain  . Lower Back - Pain   HPI: Ricky Zavala is a 77 y.o. male who comes in today For evaluation and management of his cervical spine pain but also reevaluation and documentation of his lumbar pain.  In terms of his lumbar pain we have completed double diagnostic medial branch blocks of the L3-4 and L4-5 facet joints with good relief of his symptoms and this is been documented.  The medial branch blocks gave him at least 60% relief of his low back pain but were very short-lived and the amount of time that they helped but he was very pleased when they were helping.  His low back pain is consistent with facet mediated pain.  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  He has no stenosis or radicular pain.  All of his pain is with standing and walking and twisting.  Exam is consistent with facet mediated pain with facet joint loading.  He has had chiropractic care in the past.  He continues on a home exercise program and has failed this.  His home exercise program includes going to the gym and lifting on the machines at times.  He is not able to do that recently because of the pain.  He is active around the house.  Again no radicular leg complaints or paresthesias or weakness.  No new trauma.  In terms of his neck pain, we had briefly discussed this at a prior evaluation but his back pain was more problematic at the time.  He has a history of a significant fall in 2016 and we do have MRI imaging from that time.  The MRI did show pretty significant paraspinal muscle strain with edema but no subluxation or fractures that were concerning.  There may have been a nondisplaced C5 process fracture but no slippage.  This did show multilevel facet  arthropathy particularly C4-5 C5-6 and C6-7.  Most of his pain is left-sided neck pain it hurts when he turns his head to the left and extension.  He has no radicular arm pain or shoulder pain.  Pain refer sometimes of the trapezius area.  He does use some ibuprofen this seems to help to a degree.  He rates his pain on average is a 4 out of 10 but intermittently it will increase and be quite severe.  He reports the pain in his neck seems to have been occurring ever since the fall but he does get times where it is better in times where it is not.  He has tried conservative care with it as well but no recent physical therapy but he did go through physical therapy at the time of the fall.  He continues with stretching and exercises.  He uses heat and ice.  He reports this is intermittent dull and aching pain in the left mid cervical region.  He does get some referral to the postauricular area but no associated occipital headaches.  No right-sided complaints.  Has never had cervical surgery or interventional spine procedure.  Review of Systems  Constitutional: Negative for chills, fever, malaise/fatigue and weight loss.  HENT: Negative for hearing loss and sinus pain.   Eyes: Negative for blurred vision, double vision and photophobia.  Respiratory: Negative for cough and shortness of breath.   Cardiovascular: Negative for chest pain, palpitations and leg swelling.  Gastrointestinal: Negative for abdominal pain, nausea and vomiting.  Genitourinary: Negative for flank pain.  Musculoskeletal: Positive for back pain, joint pain and neck pain. Negative for myalgias.  Skin: Negative for itching and rash.  Neurological: Negative for tremors, focal weakness and weakness.  Endo/Heme/Allergies: Negative.   Psychiatric/Behavioral: Negative for depression.  All other systems reviewed and are negative.  Otherwise per HPI.  Assessment & Plan: Visit Diagnoses:  1. Cervicalgia   2. 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 Congenital spondylolysis     Plan: Findings:  1.  Cervical spine pain on the left left neck pain and cervicalgia status post significant fall in 2016 with questionable nondisplaced C5 articular process fracture but with MRI evidence of multilevel facet arthropathy no significant central stenosis some right foraminal stenosis at C6 but no right-sided pain.  Exam is consistent with facet mediated left-sided neck pain and some idea of may be myofascial pain.  Patient has had physical therapy after the fall but not recently.  He has not had cervical procedure or surgery.  I think diagnostic medial branch blocks at C4-5 C5-6 and C6-7 would be advisable and if he did get relief we would look at a double block paradigm and possibly radiofrequency ablation.  If he did get relief we will look at more trigger point type treatment.  We will continue current use of anti-inflammatory medication on occasion.  Has tried muscle relaxers in the past and if so they have not helped.  2.  Lumbar spine pain and low back pain axial without radicular pain with MRI findings of facet arthropathy without stenosis.  Does have bilateral pars defects at L5-S1 but without much arthritis at that level.  Double diagnostic blocks at L3-4 and L4-5 have been successful in relieving 60% of the symptoms but have been very short-lived.  First procedure note was dictated erroneously and the blocks were performed at L3-4 and L4-5.  This is been amended.  Patient has had chiropractic care as well as home exercises continues to do home exercises and really cannot do a right now because of the pain level.  Pain is consistent again with facet mediated pain worse with standing going from sit to stand better at rest and with sitting.  Exam is also consistent.  He has been having this low back pain for many years with worsening over the last 6 months or so.  No specific  injury.  Will try to get preauthorization for radiofrequency ablation.  We will continue with current medications.  Radiofrequency ablation would be of the L3-4 L4-5 facet joints.  This would be bilateral.      Meds & Orders: No orders of the defined types were placed in this encounter.   Orders Placed This Encounter  Procedures  . XR Cervical Spine 2 or 3 views    Follow-up: Return for Left-sided facet joint blocks at C4-5, C5-6 and C6-7.   Procedures: No procedures performed  No notes on file   Clinical History: [facility] CERVICAL SPINE WITHOUT CONTRAST  TECHNIQUE: Multiplanar, multisequence MR imaging of the cervical spine was performed. No intravenous contrast was administered.  COMPARISON:  None.  FINDINGS: Heterogeneous marrow without focal masslike lesion.  There is marrow edema within the right C5 inferior articular process with indistinct cortex on T1 weighted sagittal imaging. Anterior  inferior corner edema also of C5, without discrete fracture line. There is mild prevertebral edema at C5 and C6 and extensive perispinous edema in the upper cervical spine. No major ligamentous deficiency or subluxation noted.  Normal cord signal and morphology.  No evidence of canal collection.  No extra-spinal findings to explain symptoms. Normal flow related signal loss in the visible cervical and carotid arteries.  Degenerative changes:  C2-3: Facet arthropathy with bulky spurring on the right, causing mild moderate foraminal stenosis. Patent canal and left foramen.  C3-4: Facet arthropathy with bulky spurring on the right. Mild right foraminal stenosis. Patent canal and open left foramen.  C4-5: Disc narrowing and small uncovertebral spurs. Facet arthropathy with mild spurring. Mild left foraminal narrowing. Patent canal and right foramen.  C5-6: Posterior disc osteophyte complex with hyperintense annulus on T2 weighted imaging. Despite traumatic findings, this is not  a definite acute disc injury. There is bilateral fairly bulky uncovertebral spurring. Facet arthropathy with mild overgrowth. Ventral thecal sac is effaced without cord compression. Bilateral foraminal stenosis which is advanced.  C6-7: Disc narrowing and endplate spurring with bulky right uncovertebral spur. Mild facet overgrowth. Right foraminal stenosis, advanced. Patent canal and left foramen.  C7-T1:Disc narrowing without herniation. Negative facets. No impingement.   CONCLUSION: 1. Paraspinal muscle strain and mild prevertebral edema without major ligamentous disruption or subluxation. Marrow edema in the C5 right inferior articular process could reflect nondisplaced fracture, recommend CT correlation. Also, attention to the C5 anterior inferior body where there is mild edema. 2. 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   Electronically Signed   By: Monte Fantasia M.D.   On: 08/19/2015 14:03  MRI LUMBAR SPINE WITHOUT CONTRAST  TECHNIQUE: Multiplanar, multisequence MR imaging of the lumbar spine was performed. No intravenous contrast was administered.  COMPARISON: None.  FINDINGS: Segmentation: The lowest lumbar type non-rib-bearing vertebra is labeled as L5.  Alignment: 7 mm of anterolisthesis at L5-S1 associated with chronic bilateral pars defects.  4 mm degenerative retrolisthesis at L2-3 with 2 mm degenerative retrolisthesis L3-4.  Vertebrae: Marrow heterogeneity is present. Although this can be caused by marrow infiltrative processes, the most common causes include anemia, smoking, obesity, or advancing age.  1.5 cm in diameter hemangioma in the T12 vertebral body.  Type 1 degenerative endplate findings along the left inferior endplate of L4. Type 2 degenerative endplate findings at Q7-M2.  Conus medullaris and cauda equina: Conus extends to the L1 level. Conus and cauda equina  appear normal.  Paraspinal and other soft tissues: Fluid signal intensity lesions of the left kidney including what is probably a larger lesion along the lower pole. These may well be cysts better technically nonspecific and only partially included in imaging.  No pathologic retroperitoneal adenopathy is observed. No appreciable abdominal aortic aneurysm.  Disc levels:  L1-2: No impingement. Mild disc bulge.  L2-3: No impingement. Diffuse disc bulge.  L3-4: Borderline bilateral subarticular lateral recess stenosis due to disc bulge and facet arthropathy.  L4-5: Borderline right subarticular lateral recess stenosis due to disc bulge.  L5-S1: Mild to moderate bilateral foraminal stenosis due to disc uncovering and subluxation along with mild facet arthropathy. This is slightly worse on the right compared to the left.  IMPRESSION: 1. 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 2. Borderline impingement at L3-4 and L4-5 due to spondylosis and  degenerative disc disease. 3. Marrow heterogeneity is present. Although this can be caused by marrow infiltrative processes, the most common causes include anemia, smoking, obesity, or advancing age. 4. We image the margin of several fluid signal intensity lesions in the vicinity of the left kidney. These are probably cysts but only a small minority of the volume of these lesions is included on today's imaging.   Electronically Signed By: Van Clines M.D. On: 05/02/2018 16:55   He reports that he has quit smoking. He has never used smokeless tobacco. No results for input(s): HGBA1C, LABURIC in the last 8760 hours.  Objective:  VS:  HT:6' (182.9 cm)   WT:212 lb (96.2 kg)  BMI:28.75    BP:(!) 143/64  HR:61bpm  TEMP: ( )  RESP:95 % Physical Exam  Constitutional: He is oriented to  person, place, and time. He appears well-developed and well-nourished. No distress.  HENT:  Head: Normocephalic and atraumatic.  Nose: Nose normal.  Mouth/Throat: Oropharynx is clear and moist.  Eyes: Pupils are equal, round, and reactive to light. Conjunctivae are normal.  Neck: Neck supple. No JVD present. No tracheal deviation present.  Cardiovascular: Regular rhythm and intact distal pulses.  Pulmonary/Chest: Effort normal and breath sounds normal.  Abdominal: Soft. He exhibits no distension. There is no rebound and no guarding.  Musculoskeletal: He exhibits no deformity.  Cervical exam shows forward flexed cervical spine with pain with left rotation and extension with some limited range of motion to the left.  Has full range of motion to the right with no pain with rotation.  Has a negative Spurling's test bilaterally.  Has mild shoulder impingement bilaterally right more than left.  Has good strength in the upper extremities and symmetric bilaterally with a negative Hoffman sign.  Has mild focal trigger points in the levator scapula and rhomboid as well as trapezius left more than right.  Some tenderness in the paraspinal region but does not reproduce his pain in total on the left.  Negative Tinel's over the occipital nerve.  Lumbar spine evaluation shows that the patient ambulates without aid.  He has some difficulty going from sit to full extension and he does have pain with facet joint loading is concordant with his low back pain.  He has no focal trigger points.  No pain over the PSIS or greater trochanters.  He has good hip movement.  He has good distal strength without clonus.  Neurological: He is alert and oriented to person, place, and time. He exhibits normal muscle tone. Coordination normal.  Skin: Skin is warm. No rash noted.  Psychiatric: He has a normal mood and affect. His behavior is normal.  Nursing note and vitals reviewed.   Ortho Exam Imaging: Xr C-arm No  Report  Result Date: 08/09/2018 Please see Notes tab for imaging impression.   Past Medical/Family/Surgical/Social History: Medications & Allergies reviewed per EMR, new medications updated. Patient Active Problem List   Diagnosis Date Noted  . Cervical spondylosis without myelopathy 08/10/2018  . Spondylosis without myelopathy or radiculopathy, lumbar region 08/10/2018  . 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 Chronic bilateral low back pain without sciatica 04/18/2018   History reviewed. No pertinent past medical history. History reviewed. No pertinent family history. History reviewed. No pertinent surgical history. Social History   Occupational History  . Not on file  Tobacco Use  . Smoking status: Former Research scientist (life sciences)  . Smokeless tobacco: Never Used  Substance and Sexual Activity  . Alcohol use: Not Currently  . Drug use: Not  Currently  . Sexual activity: Not on file

## 2018-08-10 NOTE — Procedures (Signed)
Diagnostic Cervical Facet Joint Nerve Block with Fluoroscopic Guidance  Patient: Ricky Zavala      Date of Birth: October 04, 1940 MRN: [id number] PCP: Egbert Garibaldi, PA-C      Visit Date: 08/09/2018   Universal Protocol:    Date/Time: 11/22/196:23 AM  Consent Given By: the patient  Position: PRONE  Additional Comments: Vital signs were monitored before and after the procedure. Patient was prepped and draped in the usual sterile fashion. The correct patient, procedure, and site was verified.   Injection Procedure Details:  Procedure Site One Meds Administered:  Meds ordered this encounter  Medications  . bupivacaine (MARCAINE) 0.25 % (with pres) injection 2 mL  . methylPREDNISolone acetate (DEPO-MEDROL) injection 80 mg     Laterality: Left  Location/Site:  C4-5 C5-6 C6-7  Needle size: 25 G  Needle type: Spinal  Needle Placement: Articular Pillar  Findings:  -Contrast Used: 0.5 mL iohexol 180 mg iodine/mL   -Comments: Excellent flow of contrast over the articular pillars  Procedure Details: The fluoroscope beam was positioned to square off the endplates of the desired vertebral level to achieve a true AP position. The beam was then moved in a small "counter" oblique to the contralateral side with a small amount of caudal tilt to achieve a trajectory alignment with the desired nerves.  For each target described below the skin was anesthetized with 1 ml of 1% Lidocaine without epinephrine.   To block the facet joint nerves from C3 through C7, the lateral masses of these respective levels were localized under fluoroscopic visualization.  A spinal needle was inserted down to the "waist" at the above mentioned cervical levels.  The  needle was then "walked off" until it rested just lateral to the trough of the lateral mass of the medial branch nerve, which innervates the cervical facet joint.  After contact with periosteum and negative aspirate for blood and CSF, correct  placement without intravascular or epidural spread was confirmed by Bi-planar images and  injecting 0.5 ml. of Omnipaque-240.  A spot radiograph was obtained of this image.  Next, a 0.5 ml. volume of 1% Lidocaine without Epinephrine was then injected.  Prior to the procedure, the patient was given a Pain Diary which was completed for baseline measurements.  After the procedure, the patient rated their pain every 30 minutes and will continue rating at this frequency for a total of 5 hours.  The patient has been asked to complete the Diary and return to [place] by mail, fax or hand delivered as soon as possible.   Additional Comments:  The patient tolerated the procedure well Dressing: Band-Aid    Post-procedure details: Patient was observed during the procedure. Post-procedure instructions were reviewed.  Patient left the clinic in stable condition.

## 2018-08-10 NOTE — Progress Notes (Signed)
Ricky Zavala - 77 y.o. male MRN [id number]  Date of birth: Oct 08, 1940  Office Visit Note: Visit Date: 08/09/2018 PCP: Egbert Garibaldi, PA-C Referred by: Egbert Garibaldi, PA-C  Subjective: Chief Complaint  Patient presents with  . Neck - Pain  . Head - Pain   HPI:  Ricky Zavala is a 77 y.o. male who comes in today For planned left medial branch facet blocks at C4-5 and C5-6 and C6-7.  Please see our prior evaluation and management note for further details and justification.  ROS Otherwise per HPI.  Assessment & Plan: Visit Diagnoses:  1. Cervical spondylosis without myelopathy   2. Cervicalgia     Plan: No additional findings.   Meds & Orders:  Meds ordered this encounter  Medications  . bupivacaine (MARCAINE) 0.25 % (with pres) injection 2 mL  . methylPREDNISolone acetate (DEPO-MEDROL) injection 80 mg    Orders Placed This Encounter  Procedures  . Facet Injection  . XR C-ARM NO REPORT    Follow-up: No follow-ups on file.   Procedures: No procedures performed  Diagnostic Cervical Facet Joint Nerve Block with Fluoroscopic Guidance  Patient: Ricky Zavala      Date of Birth: [birth year] MRN: [id number] PCP: Egbert Garibaldi, PA-C      Visit Date: 08/09/2018   Universal Protocol:    Date/Time: 11/22/196:23 AM  Consent Given By: the patient  Position: PRONE  Additional Comments: Vital signs were monitored before and after the procedure. Patient was prepped and draped in the usual sterile fashion. The correct patient, procedure, and site was verified.   Injection Procedure Details:  Procedure Site One Meds Administered:  Meds ordered this encounter  Medications  . bupivacaine (MARCAINE) 0.25 % (with pres) injection 2 mL  . methylPREDNISolone acetate (DEPO-MEDROL) injection 80 mg     Laterality: Left  Location/Site:  C4-5 C5-6 C6-7  Needle size: 25 G  Needle type: Spinal  Needle Placement: Articular Pillar  Findings:  -Contrast Used: 0.5 mL iohexol 180 mg  iodine/mL   -Comments: Excellent flow of contrast over the articular pillars  Procedure Details: The fluoroscope beam was positioned to square off the endplates of the desired vertebral level to achieve a true AP position. The beam was then moved in a small "counter" oblique to the contralateral side with a small amount of caudal tilt to achieve a trajectory alignment with the desired nerves.  For each target described below the skin was anesthetized with 1 ml of 1% Lidocaine without epinephrine.   To block the facet joint nerves from C3 through C7, the lateral masses of these respective levels were localized under fluoroscopic visualization.  A spinal needle was inserted down to the "waist" at the above mentioned cervical levels.  The  needle was then "walked off" until it rested just lateral to the trough of the lateral mass of the medial branch nerve, which innervates the cervical facet joint.  After contact with periosteum and negative aspirate for blood and CSF, correct placement without intravascular or epidural spread was confirmed by Bi-planar images and  injecting 0.5 ml. of Omnipaque-240.  A spot radiograph was obtained of this image.  Next, a 0.5 ml. volume of 1% Lidocaine without Epinephrine was then injected.  Prior to the procedure, the patient was given a Pain Diary which was completed for baseline measurements.  After the procedure, the patient rated their pain every 30 minutes and will continue rating at this frequency for a total of 5 hours.  The patient has been  asked to complete the Diary and return to [place] by mail, fax or hand delivered as soon as possible.   Additional Comments:  The patient tolerated the procedure well Dressing: Band-Aid    Post-procedure details: Patient was observed during the procedure. Post-procedure instructions were reviewed.  Patient left the clinic in stable condition.      Clinical History: [facility] CERVICAL SPINE WITHOUT  CONTRAST  TECHNIQUE: Multiplanar, multisequence MR imaging of the cervical spine was performed. No intravenous contrast was administered.  COMPARISON:  None.  FINDINGS: Heterogeneous marrow without focal masslike lesion.  There is marrow edema within the right C5 inferior articular process with indistinct cortex on T1 weighted sagittal imaging. Anterior inferior corner edema also of C5, without discrete fracture line. There is mild prevertebral edema at C5 and C6 and extensive perispinous edema in the upper cervical spine. No major ligamentous deficiency or subluxation noted.  Normal cord signal and morphology.  No evidence of canal collection.  No extra-spinal findings to explain symptoms. Normal flow related signal loss in the visible cervical and carotid arteries.  Degenerative changes:  C2-3: Facet arthropathy with bulky spurring on the right, causing mild moderate foraminal stenosis. Patent canal and left foramen.  C3-4: Facet arthropathy with bulky spurring on the right. Mild right foraminal stenosis. Patent canal and open left foramen.  C4-5: Disc narrowing and small uncovertebral spurs. Facet arthropathy with mild spurring. Mild left foraminal narrowing. Patent canal and right foramen.  C5-6: Posterior disc osteophyte complex with hyperintense annulus on T2 weighted imaging. Despite traumatic findings, this is not a definite acute disc injury. There is bilateral fairly bulky uncovertebral spurring. Facet arthropathy with mild overgrowth. Ventral thecal sac is effaced without cord compression. Bilateral foraminal stenosis which is advanced.  C6-7: Disc narrowing and endplate spurring with bulky right uncovertebral spur. Mild facet overgrowth. Right foraminal stenosis, advanced. Patent canal and left foramen.  C7-T1:Disc narrowing without herniation. Negative facets. No impingement.   CONCLUSION: 1. Paraspinal muscle strain and mild prevertebral edema  without major ligamentous disruption or subluxation. Marrow edema in the C5 right inferior articular process could reflect nondisplaced fracture, recommend CT correlation. Also, attention to the C5 anterior inferior body where there is mild edema. 2. 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   Electronically Signed   By: Monte Fantasia M.D.   On: 08/19/2015 14:03  MRI LUMBAR SPINE WITHOUT CONTRAST  TECHNIQUE: Multiplanar, multisequence MR imaging of the lumbar spine was performed. No intravenous contrast was administered.  COMPARISON: None.  FINDINGS: Segmentation: The lowest lumbar type non-rib-bearing vertebra is labeled as L5.  Alignment: 7 mm of anterolisthesis at L5-S1 associated with chronic bilateral pars defects.  4 mm degenerative retrolisthesis at L2-3 with 2 mm degenerative retrolisthesis L3-4.  Vertebrae: Marrow heterogeneity is present. Although this can be caused by marrow infiltrative processes, the most common causes include anemia, smoking, obesity, or advancing age.  1.5 cm in diameter hemangioma in the T12 vertebral body.  Type 1 degenerative endplate findings along the left inferior endplate of L4. Type 2 degenerative endplate findings at F6-O1.  Conus medullaris and cauda equina: Conus extends to the L1 level. Conus and cauda equina appear normal.  Paraspinal and other soft tissues: Fluid signal intensity lesions of the left kidney including what is probably a larger lesion along the lower pole. These may well be cysts better technically nonspecific and only partially included in imaging.  No pathologic retroperitoneal adenopathy is observed. No appreciable abdominal  aortic aneurysm.  Disc levels:  L1-2: No impingement. Mild disc bulge.  L2-3: No impingement. Diffuse disc bulge.  L3-4: Borderline bilateral subarticular lateral recess stenosis due to disc bulge and  facet arthropathy.  L4-5: Borderline right subarticular lateral recess stenosis due to disc bulge.  L5-S1: Mild to moderate bilateral foraminal stenosis due to disc uncovering and subluxation along with mild facet arthropathy. This is slightly worse on the right compared to the left.  IMPRESSION: 1. 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 2. Borderline impingement at L3-4 and L4-5 due to spondylosis and degenerative disc disease. 3. Marrow heterogeneity is present. Although this can be caused by marrow infiltrative processes, the most common causes include anemia, smoking, obesity, or advancing age. 4. We image the margin of several fluid signal intensity lesions in the vicinity of the left kidney. These are probably cysts but only a small minority of the volume of these lesions is included on today's imaging.   Electronically Signed By: Van Clines M.D. On: 05/02/2018 16:55     Objective:  VS:  HT:    WT:   BMI:     BP:(!) 155/79  HR:60bpm  TEMP:98.4 F (36.9 C)(Oral)  RESP:  Physical Exam  Ortho Exam Imaging: Xr C-arm No Report  Result Date: 08/09/2018 Please see Notes tab for imaging impression.

## 2018-09-03 ENCOUNTER — Ambulatory Visit (INDEPENDENT_AMBULATORY_CARE_PROVIDER_SITE_OTHER): Payer: Self-pay

## 2018-09-03 ENCOUNTER — Encounter (INDEPENDENT_AMBULATORY_CARE_PROVIDER_SITE_OTHER): Payer: Self-pay | Admitting: Physical Medicine and Rehabilitation

## 2018-09-03 ENCOUNTER — Ambulatory Visit (INDEPENDENT_AMBULATORY_CARE_PROVIDER_SITE_OTHER): Payer: Medicare Other | Admitting: Physical Medicine and Rehabilitation

## 2018-09-03 VITALS — BP 142/77 | HR 69 | Temp 97.9°F

## 2018-09-03 DIAGNOSIS — M47816 Spondylosis without myelopathy or radiculopathy, lumbar region: Secondary | ICD-10-CM | POA: Diagnosis not present

## 2018-09-03 MED ORDER — METHYLPREDNISOLONE ACETATE 80 MG/ML IJ SUSP
80.0000 mg | Freq: Once | INTRAMUSCULAR | Status: AC
  Administered 2018-09-03: 80 mg

## 2018-09-03 NOTE — Patient Instructions (Signed)

## 2018-09-03 NOTE — Procedures (Signed)
Lumbar Facet Joint Nerve Denervation  Patient: Ricky Zavala      Date of Birth: Jan 21, 1941 MRN: [id number] PCP: Egbert Garibaldi, PA-C      Visit Date: 09/03/2018   Universal Protocol:    Date/Time: 12/16/194:52 PM  Consent Given By: the patient  Position: PRONE  Additional Comments: Vital signs were monitored before and after the procedure. Patient was prepped and draped in the usual sterile fashion. The correct patient, procedure, and site was verified.   Injection Procedure Details:  Procedure Site One Meds Administered:  Meds ordered this encounter  Medications  . methylPREDNISolone acetate (DEPO-MEDROL) injection 80 mg     Laterality: Right  Location/Site:  L4-L5 L5-S1  Needle size: 18 G  Needle type: Radiofrequency cannula  Needle Placement: Along juncture of superior articular process and transverse pocess  Findings:  -Comments:  Procedure Details: For each desired target nerve, the corresponding transverse process (sacral ala for the L5 dorsal rami) was identified and the fluoroscope was positioned to square off the endplates of the corresponding vertebral body to achieve a true AP midline view.  The beam was then obliqued 15 to 20 degrees and caudally tilted 15 to 20 degrees to line up a trajectory along the target nerves. The skin over the target of the junction of superior articulating process and transverse process (sacral ala for the L5 dorsal rami) was infiltrated with 69ml of 1% Lidocaine without Epinephrine.  The 18 gauge 61mm active tip outer cannula was advanced in trajectory view to the target.  This procedure was repeated for each target nerve.  Then, for all levels, the outer cannula placement was fine-tuned and the position was then confirmed with bi-planar imaging.    Test stimulation was done both at sensory and motor levels to ensure there was no radicular stimulation. The target tissues were then infiltrated with 1 ml of 1% Lidocaine without  Epinephrine. Subsequently, a percutaneous neurotomy was carried out for 60 seconds at 80 degrees Celsius. The procedure was repeated with the cannula rotated 90 degrees, for duration of 60 seconds, one additional time at each level for a total of two lesions per level.  After the completion of the two lesions, 1 ml of injectate was delivered. It was then repeated for each facet joint nerve mentioned above. Appropriate radiographs were obtained to verify the probe placement during the neurotomy.   Additional Comments:  The patient tolerated the procedure well Dressing: Band-Aid    Post-procedure details: Patient was observed during the procedure. Post-procedure instructions were reviewed.  Patient left the clinic in stable condition.

## 2018-09-03 NOTE — Progress Notes (Signed)
[name] - 77 y.o. male MRN [id number]  Date of birth: [birth year]  Office Visit Note: Visit Date: 09/03/2018 PCP: Egbert Garibaldi, PA-C Referred by: Egbert Garibaldi, PA-C  Subjective: Chief Complaint  Patient presents with  . Lower Back - Pain   HPI:  Ricky Zavala is a 77 y.o. male who comes in today For planned right L4-5 and L5-S1 facet joint ablation.  He has had double diagnostic medial branch blocks with good relief and documented on pain diary.  Relief is been more than 75% relief.  He has mostly right-sided more than left-sided axial back pain.  Is worse with facet joint loading on exam and worse with standing and better with sitting and at rest.  He has no MRI evidence of nerve compression and no radicular pain.  He is failed conservative care including medication management and therapy and home exercise.  ROS Otherwise per HPI.  Assessment & Plan: Visit Diagnoses:  1. Spondylosis without myelopathy or radiculopathy, lumbar region     Plan: No additional findings.   Meds & Orders:  Meds ordered this encounter  Medications  . methylPREDNISolone acetate (DEPO-MEDROL) injection 80 mg    Orders Placed This Encounter  Procedures  . Radiofrequency,Lumbar  . XR C-ARM NO REPORT    Follow-up: No follow-ups on file.   Procedures: No procedures performed  Lumbar Facet Joint Nerve Denervation  Patient: Ricky Zavala      Date of Birth: [birth year] MRN: [id number] PCP: Egbert Garibaldi, PA-C      Visit Date: 09/03/2018   Universal Protocol:    Date/Time: 12/16/194:52 PM  Consent Given By: the patient  Position: PRONE  Additional Comments: Vital signs were monitored before and after the procedure. Patient was prepped and draped in the usual sterile fashion. The correct patient, procedure, and site was verified.   Injection Procedure Details:  Procedure Site One Meds Administered:  Meds ordered this encounter  Medications  . methylPREDNISolone acetate (DEPO-MEDROL)  injection 80 mg     Laterality: Right  Location/Site:  L4-L5 L5-S1  Needle size: 18 G  Needle type: Radiofrequency cannula  Needle Placement: Along juncture of superior articular process and transverse pocess  Findings:  -Comments:  Procedure Details: For each desired target nerve, the corresponding transverse process (sacral ala for the L5 dorsal rami) was identified and the fluoroscope was positioned to square off the endplates of the corresponding vertebral body to achieve a true AP midline view.  The beam was then obliqued 15 to 20 degrees and caudally tilted 15 to 20 degrees to line up a trajectory along the target nerves. The skin over the target of the junction of superior articulating process and transverse process (sacral ala for the L5 dorsal rami) was infiltrated with 35ml of 1% Lidocaine without Epinephrine.  The 18 gauge 17mm active tip outer cannula was advanced in trajectory view to the target.  This procedure was repeated for each target nerve.  Then, for all levels, the outer cannula placement was fine-tuned and the position was then confirmed with bi-planar imaging.    Test stimulation was done both at sensory and motor levels to ensure there was no radicular stimulation. The target tissues were then infiltrated with 1 ml of 1% Lidocaine without Epinephrine. Subsequently, a percutaneous neurotomy was carried out for 60 seconds at 80 degrees Celsius. The procedure was repeated with the cannula rotated 90 degrees, for duration of 60 seconds, one additional time at each level for a total of two lesions per level.  After the completion of the two lesions, 1 ml of injectate was delivered. It was then repeated for each facet joint nerve mentioned above. Appropriate radiographs were obtained to verify the probe placement during the neurotomy.   Additional Comments:  The patient tolerated the procedure well Dressing: Band-Aid    Post-procedure details: Patient was observed  during the procedure. Post-procedure instructions were reviewed.  Patient left the clinic in stable condition.      Clinical History: [facility] CERVICAL SPINE WITHOUT CONTRAST  TECHNIQUE: Multiplanar, multisequence MR imaging of the cervical spine was performed. No intravenous contrast was administered.  COMPARISON:  None.  FINDINGS: Heterogeneous marrow without focal masslike lesion.  There is marrow edema within the right C5 inferior articular process with indistinct cortex on T1 weighted sagittal imaging. Anterior inferior corner edema also of C5, without discrete fracture line. There is mild prevertebral edema at C5 and C6 and extensive perispinous edema in the upper cervical spine. No major ligamentous deficiency or subluxation noted.  Normal cord signal and morphology.  No evidence of canal collection.  No extra-spinal findings to explain symptoms. Normal flow related signal loss in the visible cervical and carotid arteries.  Degenerative changes:  C2-3: Facet arthropathy with bulky spurring on the right, causing mild moderate foraminal stenosis. Patent canal and left foramen.  C3-4: Facet arthropathy with bulky spurring on the right. Mild right foraminal stenosis. Patent canal and open left foramen.  C4-5: Disc narrowing and small uncovertebral spurs. Facet arthropathy with mild spurring. Mild left foraminal narrowing. Patent canal and right foramen.  C5-6: Posterior disc osteophyte complex with hyperintense annulus on T2 weighted imaging. Despite traumatic findings, this is not a definite acute disc injury. There is bilateral fairly bulky uncovertebral spurring. Facet arthropathy with mild overgrowth. Ventral thecal sac is effaced without cord compression. Bilateral foraminal stenosis which is advanced.  C6-7: Disc narrowing and endplate spurring with bulky right uncovertebral spur. Mild facet overgrowth. Right foraminal stenosis, advanced. Patent canal and  left foramen.  C7-T1:Disc narrowing without herniation. Negative facets. No impingement.   CONCLUSION: 1. Paraspinal muscle strain and mild prevertebral edema without major ligamentous disruption or subluxation. Marrow edema in the C5 right inferior articular process could reflect nondisplaced fracture, recommend CT correlation. Also, attention to the C5 anterior inferior body where there is mild edema. 2. 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   Electronically Signed   By: Monte Fantasia M.D.   On: 08/19/2015 14:03  MRI LUMBAR SPINE WITHOUT CONTRAST  TECHNIQUE: Multiplanar, multisequence MR imaging of the lumbar spine was performed. No intravenous contrast was administered.  COMPARISON: None.  FINDINGS: Segmentation: The lowest lumbar type non-rib-bearing vertebra is labeled as L5.  Alignment: 7 mm of anterolisthesis at L5-S1 associated with chronic bilateral pars defects.  4 mm degenerative retrolisthesis at L2-3 with 2 mm degenerative retrolisthesis L3-4.  Vertebrae: Marrow heterogeneity is present. Although this can be caused by marrow infiltrative processes, the most common causes include anemia, smoking, obesity, or advancing age.  1.5 cm in diameter hemangioma in the T12 vertebral body.  Type 1 degenerative endplate findings along the left inferior endplate of L4. Type 2 degenerative endplate findings at T0-Z6.  Conus medullaris and cauda equina: Conus extends to the L1 level. Conus and cauda equina appear normal.  Paraspinal and other soft tissues: Fluid signal intensity lesions of the left kidney including what is probably a larger lesion along the lower pole. These may well be cysts better technically  nonspecific and only partially included in imaging.  No pathologic retroperitoneal adenopathy is observed. No appreciable abdominal aortic aneurysm.  Disc levels:  L1-2: No  impingement. Mild disc bulge.  L2-3: No impingement. Diffuse disc bulge.  L3-4: Borderline bilateral subarticular lateral recess stenosis due to disc bulge and facet arthropathy.  L4-5: Borderline right subarticular lateral recess stenosis due to disc bulge.  L5-S1: Mild to moderate bilateral foraminal stenosis due to disc uncovering and subluxation along with mild facet arthropathy. This is slightly worse on the right compared to the left.  IMPRESSION: 1. 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 2. Borderline impingement at L3-4 and L4-5 due to spondylosis and degenerative disc disease. 3. Marrow heterogeneity is present. Although this can be caused by marrow infiltrative processes, the most common causes include anemia, smoking, obesity, or advancing age. 4. We image the margin of several fluid signal intensity lesions in the vicinity of the left kidney. These are probably cysts but only a small minority of the volume of these lesions is included on today's imaging.   Electronically Signed By: Van Clines M.D. On: 05/02/2018 16:55     Objective:  VS:  HT:    WT:   BMI:     BP:(!) 142/77  HR:69bpm  TEMP:97.9 F (36.6 C)(Oral)  RESP:  Physical Exam  Ortho Exam Imaging: Xr C-arm No Report  Result Date: 09/03/2018 Please see Notes tab for imaging impression.

## 2018-09-03 NOTE — Progress Notes (Signed)
 .  Numeric Pain Rating Scale and Functional Assessment Average Pain 5   In the last MONTH (on 0-10 scale) has pain interfered with the following?  1. General activity like being  able to carry out your everyday physical activities such as walking, climbing stairs, carrying groceries, or moving a chair?  Rating(4)   +Driver, -BT, -Dye Allergies.  

## 2018-09-20 ENCOUNTER — Encounter (INDEPENDENT_AMBULATORY_CARE_PROVIDER_SITE_OTHER): Payer: Self-pay | Admitting: Physical Medicine and Rehabilitation

## 2018-09-20 ENCOUNTER — Ambulatory Visit (INDEPENDENT_AMBULATORY_CARE_PROVIDER_SITE_OTHER): Payer: Medicare Other | Admitting: Physical Medicine and Rehabilitation

## 2018-09-20 ENCOUNTER — Ambulatory Visit (INDEPENDENT_AMBULATORY_CARE_PROVIDER_SITE_OTHER): Payer: Self-pay

## 2018-09-20 VITALS — BP 147/83 | HR 55 | Temp 97.6°F

## 2018-09-20 DIAGNOSIS — G8929 Other chronic pain: Secondary | ICD-10-CM | POA: Diagnosis not present

## 2018-09-20 DIAGNOSIS — M47816 Spondylosis without myelopathy or radiculopathy, lumbar region: Secondary | ICD-10-CM

## 2018-09-20 DIAGNOSIS — M545 Low back pain: Secondary | ICD-10-CM | POA: Diagnosis not present

## 2018-09-20 MED ORDER — METHYLPREDNISOLONE ACETATE 80 MG/ML IJ SUSP
80.0000 mg | Freq: Once | INTRAMUSCULAR | Status: AC
  Administered 2018-09-20: 80 mg

## 2018-09-20 NOTE — Patient Instructions (Signed)

## 2018-09-20 NOTE — Progress Notes (Signed)
 .  Numeric Pain Rating Scale and Functional Assessment Average Pain 7   In the last MONTH (on 0-10 scale) has pain interfered with the following?  1. General activity like being  able to carry out your everyday physical activities such as walking, climbing stairs, carrying groceries, or moving a chair?  Rating(5)   +Driver, -BT, -Dye Allergies.  

## 2018-09-21 NOTE — Progress Notes (Signed)
Ricky Zavala - 78 y.o. male MRN [id number]  Date of birth: January 02, 1941  Office Visit Note: Visit Date: 09/20/2018 PCP: Egbert Garibaldi, PA-C Referred by: Egbert Garibaldi, PA-C  Subjective: Chief Complaint  Patient presents with  . Lower Back - Pain   HPI:  [name] is a 78 y.o. male who comes in today For follow-up and possible left-sided radiofrequency ablation of the L4-5 and L5-S1 facet joints.  Patient had a right-sided procedure performed on 09/03/2018 initially had some relief but continues to have right-sided pain.  Left side is more prominent at this point.  Cautioned the patient to realize the procedure probably will take a good solid 4 to 5 weeks on average to see for results and explained the pathology behind that.  We will complete left sided ablation today.  Please see our prior notes for further details and justification.  ROS Otherwise per HPI.  Assessment & Plan: Visit Diagnoses:  1. Spondylosis without myelopathy or radiculopathy, lumbar region   2. Chronic bilateral low back pain without sciatica     Plan: No additional findings.   Meds & Orders:  Meds ordered this encounter  Medications  . methylPREDNISolone acetate (DEPO-MEDROL) injection 80 mg    Orders Placed This Encounter  Procedures  . Radiofrequency,Lumbar  . XR C-ARM NO REPORT    Follow-up: Return if symptoms worsen or fail to improve.   Procedures: No procedures performed  Lumbar Facet Joint Nerve Denervation  Patient: Ricky Zavala      Date of Birth: [birth year] MRN: [id number] PCP: Egbert Garibaldi, PA-C      Visit Date: 09/20/2018   Universal Protocol:    Date/Time: 01/03/206:33 AM  Consent Given By: the patient  Position: PRONE  Additional Comments: Vital signs were monitored before and after the procedure. Patient was prepped and draped in the usual sterile fashion. The correct patient, procedure, and site was verified.   Injection Procedure Details:  Procedure Site One Meds  Administered:  Meds ordered this encounter  Medications  . methylPREDNISolone acetate (DEPO-MEDROL) injection 80 mg     Laterality: Left  Location/Site:  L4-L5 L5-S1  Needle size: 18 G  Needle type: Radiofrequency cannula  Needle Placement: Along juncture of superior articular process and transverse pocess  Findings:  -Comments:  Procedure Details: For each desired target nerve, the corresponding transverse process (sacral ala for the L5 dorsal rami) was identified and the fluoroscope was positioned to square off the endplates of the corresponding vertebral body to achieve a true AP midline view.  The beam was then obliqued 15 to 20 degrees and caudally tilted 15 to 20 degrees to line up a trajectory along the target nerves. The skin over the target of the junction of superior articulating process and transverse process (sacral ala for the L5 dorsal rami) was infiltrated with 69ml of 1% Lidocaine without Epinephrine.  The 18 gauge 41mm active tip outer cannula was advanced in trajectory view to the target.  This procedure was repeated for each target nerve.  Then, for all levels, the outer cannula placement was fine-tuned and the position was then confirmed with bi-planar imaging.    Test stimulation was done both at sensory and motor levels to ensure there was no radicular stimulation. The target tissues were then infiltrated with 1 ml of 1% Lidocaine without Epinephrine. Subsequently, a percutaneous neurotomy was carried out for 60 seconds at 80 degrees Celsius. The procedure was repeated with the cannula rotated 90 degrees, for duration of 60 seconds, one  additional time at each level for a total of two lesions per level.  After the completion of the two lesions, 1 ml of injectate was delivered. It was then repeated for each facet joint nerve mentioned above. Appropriate radiographs were obtained to verify the probe placement during the neurotomy.   Additional Comments:  The patient  tolerated the procedure well Dressing: Band-Aid    Post-procedure details: Patient was observed during the procedure. Post-procedure instructions were reviewed.  Patient left the clinic in stable condition.      Clinical History: [facility] CERVICAL SPINE WITHOUT CONTRAST  TECHNIQUE: Multiplanar, multisequence MR imaging of the cervical spine was performed. No intravenous contrast was administered.  COMPARISON:  None.  FINDINGS: Heterogeneous marrow without focal masslike lesion.  There is marrow edema within the right C5 inferior articular process with indistinct cortex on T1 weighted sagittal imaging. Anterior inferior corner edema also of C5, without discrete fracture line. There is mild prevertebral edema at C5 and C6 and extensive perispinous edema in the upper cervical spine. No major ligamentous deficiency or subluxation noted.  Normal cord signal and morphology.  No evidence of canal collection.  No extra-spinal findings to explain symptoms. Normal flow related signal loss in the visible cervical and carotid arteries.  Degenerative changes:  C2-3: Facet arthropathy with bulky spurring on the right, causing mild moderate foraminal stenosis. Patent canal and left foramen.  C3-4: Facet arthropathy with bulky spurring on the right. Mild right foraminal stenosis. Patent canal and open left foramen.  C4-5: Disc narrowing and small uncovertebral spurs. Facet arthropathy with mild spurring. Mild left foraminal narrowing. Patent canal and right foramen.  C5-6: Posterior disc osteophyte complex with hyperintense annulus on T2 weighted imaging. Despite traumatic findings, this is not a definite acute disc injury. There is bilateral fairly bulky uncovertebral spurring. Facet arthropathy with mild overgrowth. Ventral thecal sac is effaced without cord compression. Bilateral foraminal stenosis which is advanced.  C6-7: Disc narrowing and endplate spurring with bulky  right uncovertebral spur. Mild facet overgrowth. Right foraminal stenosis, advanced. Patent canal and left foramen.  C7-T1:Disc narrowing without herniation. Negative facets. No impingement.   CONCLUSION: 1. Paraspinal muscle strain and mild prevertebral edema without major ligamentous disruption or subluxation. Marrow edema in the C5 right inferior articular process could reflect nondisplaced fracture, recommend CT correlation. Also, attention to the C5 anterior inferior body where there is mild edema. 2. 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   Electronically Signed   By: Monte Fantasia M.D.   On: 08/19/2015 14:03  MRI LUMBAR SPINE WITHOUT CONTRAST  TECHNIQUE: Multiplanar, multisequence MR imaging of the lumbar spine was performed. No intravenous contrast was administered.  COMPARISON: None.  FINDINGS: Segmentation: The lowest lumbar type non-rib-bearing vertebra is labeled as L5.  Alignment: 7 mm of anterolisthesis at L5-S1 associated with chronic bilateral pars defects.  4 mm degenerative retrolisthesis at L2-3 with 2 mm degenerative retrolisthesis L3-4.  Vertebrae: Marrow heterogeneity is present. Although this can be caused by marrow infiltrative processes, the most common causes include anemia, smoking, obesity, or advancing age.  1.5 cm in diameter hemangioma in the T12 vertebral body.  Type 1 degenerative endplate findings along the left inferior endplate of L4. Type 2 degenerative endplate findings at J2-E2.  Conus medullaris and cauda equina: Conus extends to the L1 level. Conus and cauda equina appear normal.  Paraspinal and other soft tissues: Fluid signal intensity lesions of the left kidney including what is probably  a larger lesion along the lower pole. These may well be cysts better technically nonspecific and only partially included in imaging.  No pathologic  retroperitoneal adenopathy is observed. No appreciable abdominal aortic aneurysm.  Disc levels:  L1-2: No impingement. Mild disc bulge.  L2-3: No impingement. Diffuse disc bulge.  L3-4: Borderline bilateral subarticular lateral recess stenosis due to disc bulge and facet arthropathy.  L4-5: Borderline right subarticular lateral recess stenosis due to disc bulge.  L5-S1: Mild to moderate bilateral foraminal stenosis due to disc uncovering and subluxation along with mild facet arthropathy. This is slightly worse on the right compared to the left.  IMPRESSION: 1. 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 2. Borderline impingement at L3-4 and L4-5 due to spondylosis and degenerative disc disease. 3. Marrow heterogeneity is present. Although this can be caused by marrow infiltrative processes, the most common causes include anemia, smoking, obesity, or advancing age. 4. We image the margin of several fluid signal intensity lesions in the vicinity of the left kidney. These are probably cysts but only a small minority of the volume of these lesions is included on today's imaging.   Electronically Signed By: Van Clines M.D. On: 05/02/2018 16:55     Objective:  VS:  HT:    WT:   BMI:     BP:(!) 147/83  HR:(!) 55bpm  TEMP:97.6 F (36.4 C)(Oral)  RESP:  Physical Exam  Ortho Exam Imaging: Xr C-arm No Report  Result Date: 09/20/2018 Please see Notes tab for imaging impression.

## 2018-09-21 NOTE — Procedures (Signed)
Lumbar Facet Joint Nerve Denervation  Patient: Ricky Zavala      Date of Birth: 1940-12-08 MRN: [id number] PCP: Egbert Garibaldi, PA-C      Visit Date: 09/20/2018   Universal Protocol:    Date/Time: 01/03/206:33 AM  Consent Given By: the patient  Position: PRONE  Additional Comments: Vital signs were monitored before and after the procedure. Patient was prepped and draped in the usual sterile fashion. The correct patient, procedure, and site was verified.   Injection Procedure Details:  Procedure Site One Meds Administered:  Meds ordered this encounter  Medications  . methylPREDNISolone acetate (DEPO-MEDROL) injection 80 mg     Laterality: Left  Location/Site:  L4-L5 L5-S1  Needle size: 18 G  Needle type: Radiofrequency cannula  Needle Placement: Along juncture of superior articular process and transverse pocess  Findings:  -Comments:  Procedure Details: For each desired target nerve, the corresponding transverse process (sacral ala for the L5 dorsal rami) was identified and the fluoroscope was positioned to square off the endplates of the corresponding vertebral body to achieve a true AP midline view.  The beam was then obliqued 15 to 20 degrees and caudally tilted 15 to 20 degrees to line up a trajectory along the target nerves. The skin over the target of the junction of superior articulating process and transverse process (sacral ala for the L5 dorsal rami) was infiltrated with 45ml of 1% Lidocaine without Epinephrine.  The 18 gauge 59mm active tip outer cannula was advanced in trajectory view to the target.  This procedure was repeated for each target nerve.  Then, for all levels, the outer cannula placement was fine-tuned and the position was then confirmed with bi-planar imaging.    Test stimulation was done both at sensory and motor levels to ensure there was no radicular stimulation. The target tissues were then infiltrated with 1 ml of 1% Lidocaine without  Epinephrine. Subsequently, a percutaneous neurotomy was carried out for 60 seconds at 80 degrees Celsius. The procedure was repeated with the cannula rotated 90 degrees, for duration of 60 seconds, one additional time at each level for a total of two lesions per level.  After the completion of the two lesions, 1 ml of injectate was delivered. It was then repeated for each facet joint nerve mentioned above. Appropriate radiographs were obtained to verify the probe placement during the neurotomy.   Additional Comments:  The patient tolerated the procedure well Dressing: Band-Aid    Post-procedure details: Patient was observed during the procedure. Post-procedure instructions were reviewed.  Patient left the clinic in stable condition.

## 2018-10-24 ENCOUNTER — Encounter (INDEPENDENT_AMBULATORY_CARE_PROVIDER_SITE_OTHER): Payer: Self-pay | Admitting: Physical Medicine and Rehabilitation

## 2018-10-24 ENCOUNTER — Ambulatory Visit (INDEPENDENT_AMBULATORY_CARE_PROVIDER_SITE_OTHER): Payer: Medicare Other | Admitting: Physical Medicine and Rehabilitation

## 2018-10-24 VITALS — BP 143/79 | HR 60 | Ht 71.0 in | Wt 210.0 lb

## 2018-10-24 DIAGNOSIS — G8929 Other chronic pain: Secondary | ICD-10-CM

## 2018-10-24 DIAGNOSIS — M545 Low back pain: Secondary | ICD-10-CM | POA: Diagnosis not present

## 2018-10-24 DIAGNOSIS — M542 Cervicalgia: Secondary | ICD-10-CM | POA: Diagnosis not present

## 2018-10-24 DIAGNOSIS — M47816 Spondylosis without myelopathy or radiculopathy, lumbar region: Secondary | ICD-10-CM | POA: Diagnosis not present

## 2018-10-24 DIAGNOSIS — M47812 Spondylosis without myelopathy or radiculopathy, cervical region: Secondary | ICD-10-CM

## 2018-10-24 NOTE — Progress Notes (Signed)
 .  Numeric Pain Rating Scale and Functional Assessment Average Pain 3 Pain Right Now 0 My pain is intermittent Pain is worse with: turning head to the left Pain improves with: nothing   In the last MONTH (on 0-10 scale) has pain interfered with the following?  1. General activity like being  able to carry out your everyday physical activities such as walking, climbing stairs, carrying groceries, or moving a chair?  Rating(2)  2. Relation with others like being able to carry out your usual social activities and roles such as  activities at home, at work and in your community. Rating(1)  3. Enjoyment of life such that you have  been bothered by emotional problems such as feeling anxious, depressed or irritable?  Rating(0)

## 2018-10-25 ENCOUNTER — Encounter (INDEPENDENT_AMBULATORY_CARE_PROVIDER_SITE_OTHER): Payer: Self-pay | Admitting: Physical Medicine and Rehabilitation

## 2018-10-25 ENCOUNTER — Telehealth (INDEPENDENT_AMBULATORY_CARE_PROVIDER_SITE_OTHER): Payer: Self-pay | Admitting: Radiology

## 2018-10-25 NOTE — Telephone Encounter (Signed)
[facility] called asking for [place] to fax RX for PT to them for this patient.  Patient called and said that he was needing to see them for dry needling.  I looked in chart, no referral to PT in chart.  Can you please enter referral to PT in epic and fax to them? [phone].  I was trying to go ahead and do this, but did not see it there already, patient was just seen yesterday.  Thanks.

## 2018-10-25 NOTE — Progress Notes (Signed)
Ricky Zavala - 78 y.o. male MRN [id number]  Date of birth: Oct 20, 1940  Office Visit Note: Visit Date: 10/24/2018 PCP: Egbert Garibaldi, PA-C Referred by: Egbert Garibaldi, PA-C  Subjective: Chief Complaint  Patient presents with  . Neck - Pain   HPI: Ricky Zavala is a 78 y.o. male who comes in today For evaluation and management of worsening chronic left-sided axial neck pain and follow-up after 4 weeks status post radiofrequency ablation of his lumbar spine.  Radiofrequency ablation of the lower lumbar facet joints was completed without any issue and he reports significant pain relief now of his lower back and is pretty happy.  He denies any new lumbar symptoms or radicular pain.  Doing well in that regard.  In terms of his neck pain we have seen him in the past for neck pain.  He has a history of significant fall in 2016 where he did sustain spinous process nondisplaced fracture and some stress reaction.  It was found at that time on MRI which is reviewed with the patient again today with spine models and pictures that there was a central disc osteophyte at C5-6.  No focal stenosis or central stenosis other than very mild.  He does have facet arthropathy some by foraminal narrowing.  His complaints are pain in the left side of the neck mid cervical to upper with rotation to the left.  No pain with right rotation.  He reports symptoms are better after prior cervical facet joint block several months ago.  He has not had recent physical therapy for his neck.  He continues to take over-the-counter medication for pain relief.  He has not had radicular complaints down the arms no associated headaches or blurry vision or balance difficulties.  No new trauma.  Review of Systems  Constitutional: Negative for chills, fever, malaise/fatigue and weight loss.  HENT: Negative for hearing loss and sinus pain.   Eyes: Negative for blurred vision, double vision and photophobia.  Respiratory: Negative for cough and  shortness of breath.   Cardiovascular: Negative for chest pain, palpitations and leg swelling.  Gastrointestinal: Negative for abdominal pain, nausea and vomiting.  Genitourinary: Negative for flank pain.  Musculoskeletal: Positive for back pain, joint pain and neck pain. Negative for myalgias.  Skin: Negative for itching and rash.  Neurological: Negative for tremors, focal weakness and weakness.  Endo/Heme/Allergies: Negative.   Psychiatric/Behavioral: Negative for depression.  All other systems reviewed and are negative.  Otherwise per HPI.  Assessment & Plan: Visit Diagnoses:  1. Cervicalgia   2. Cervical spondylosis without myelopathy   3. Spondylosis without myelopathy or radiculopathy, lumbar region   4. Chronic bilateral low back pain without sciatica     Plan: Findings:  1.  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  Fairly prominent trigger points in the levator scapula rhomboid and paraspinal region.  These do reproduce some of his pain.  He also has some pain consistent with facet arthropathy with pain with rotation limited range of motion although it is mild.  I think the best approach is regrouping with a physical therapist to look at treatment of the trigger points and myofascial pain with hopefully dry needling.  He is currently in physical therapy for his plantar fasciitis through another physician.  I have written a prescription he can take to the current physical therapist to try to work on the neck.  If they do not do dry needling at this  facility which is in [place] there is a Electronics engineer physical therapy group in both [place] and [place] that can do dry needling.  If he fails to get relief or it worsens we would look at second diagnostic block and may be radiofrequency ablation.  2.  Chronic low back pain now improved after radiofrequency ablation of the lower lumbar facets.  We will  just keep an eye on his low back pain and obviously repeat this if his symptoms recur after 6 months or more.    Meds & Orders: No orders of the defined types were placed in this encounter.  No orders of the defined types were placed in this encounter.   Follow-up: Return if symptoms worsen or fail to improve.   Procedures: No procedures performed  No notes on file   Clinical History: [facility] CERVICAL SPINE WITHOUT CONTRAST  TECHNIQUE: Multiplanar, multisequence MR imaging of the cervical spine was performed. No intravenous contrast was administered.  COMPARISON:  None.  FINDINGS: Heterogeneous marrow without focal masslike lesion.  There is marrow edema within the right C5 inferior articular process with indistinct cortex on T1 weighted sagittal imaging. Anterior inferior corner edema also of C5, without discrete fracture line. There is mild prevertebral edema at C5 and C6 and extensive perispinous edema in the upper cervical spine. No major ligamentous deficiency or subluxation noted.  Normal cord signal and morphology.  No evidence of canal collection.  No extra-spinal findings to explain symptoms. Normal flow related signal loss in the visible cervical and carotid arteries.  Degenerative changes:  C2-3: Facet arthropathy with bulky spurring on the right, causing mild moderate foraminal stenosis. Patent canal and left foramen.  C3-4: Facet arthropathy with bulky spurring on the right. Mild right foraminal stenosis. Patent canal and open left foramen.  C4-5: Disc narrowing and small uncovertebral spurs. Facet arthropathy with mild spurring. Mild left foraminal narrowing. Patent canal and right foramen.  C5-6: Posterior disc osteophyte complex with hyperintense annulus on T2 weighted imaging. Despite traumatic findings, this is not a definite acute disc injury. There is bilateral fairly bulky uncovertebral spurring. Facet arthropathy with mild overgrowth. Ventral  thecal sac is effaced without cord compression. Bilateral foraminal stenosis which is advanced.  C6-7: Disc narrowing and endplate spurring with bulky right uncovertebral spur. Mild facet overgrowth. Right foraminal stenosis, advanced. Patent canal and left foramen.  C7-T1:Disc narrowing without herniation. Negative facets. No impingement.   CONCLUSION: 1. Paraspinal muscle strain and mild prevertebral edema without major ligamentous disruption or subluxation. Marrow edema in the C5 right inferior articular process could reflect nondisplaced fracture, recommend CT correlation. Also, attention to the C5 anterior inferior body where there is mild edema. 2. 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   Electronically Signed   By: Monte Fantasia M.D.   On: 08/19/2015 14:03  MRI LUMBAR SPINE WITHOUT CONTRAST  TECHNIQUE: Multiplanar, multisequence MR imaging of the lumbar spine was performed. No intravenous contrast was administered.  COMPARISON: None.  FINDINGS: Segmentation: The lowest lumbar type non-rib-bearing vertebra is labeled as L5.  Alignment: 7 mm of anterolisthesis at L5-S1 associated with chronic bilateral pars defects.  4 mm degenerative retrolisthesis at L2-3 with 2 mm degenerative retrolisthesis L3-4.  Vertebrae: Marrow heterogeneity is present. Although this can be caused by marrow infiltrative processes, the most common causes include anemia, smoking, obesity, or advancing age.  1.5 cm in diameter hemangioma in the T12 vertebral body.  Type 1 degenerative endplate findings along  the left inferior endplate of L4. Type 2 degenerative endplate findings at E3-M6.  Conus medullaris and cauda equina: Conus extends to the L1 level. Conus and cauda equina appear normal.  Paraspinal and other soft tissues: Fluid signal intensity lesions of the left kidney including what is probably a larger  lesion along the lower pole. These may well be cysts better technically nonspecific and only partially included in imaging.  No pathologic retroperitoneal adenopathy is observed. No appreciable abdominal aortic aneurysm.  Disc levels:  L1-2: No impingement. Mild disc bulge.  L2-3: No impingement. Diffuse disc bulge.  L3-4: Borderline bilateral subarticular lateral recess stenosis due to disc bulge and facet arthropathy.  L4-5: Borderline right subarticular lateral recess stenosis due to disc bulge.  L5-S1: Mild to moderate bilateral foraminal stenosis due to disc uncovering and subluxation along with mild facet arthropathy. This is slightly worse on the right compared to the left.  IMPRESSION: 1. 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 2. Borderline impingement at L3-4 and L4-5 due to spondylosis and degenerative disc disease. 3. Marrow heterogeneity is present. Although this can be caused by marrow infiltrative processes, the most common causes include anemia, smoking, obesity, or advancing age. 4. We image the margin of several fluid signal intensity lesions in the vicinity of the left kidney. These are probably cysts but only a small minority of the volume of these lesions is included on today's imaging.   Electronically Signed By: Van Clines M.D. On: 05/02/2018 16:55   He reports that he has quit smoking. He has never used smokeless tobacco. No results for input(s): HGBA1C, LABURIC in the last 8760 hours.  Objective:  VS:  HT:5\' 11"  (180.3 cm)   WT:210 lb (95.3 kg)  BMI:29.3    BP:(!) 143/79  HR:60bpm  TEMP: ( )  RESP:  Physical Exam Vitals signs and nursing note reviewed.  Constitutional:      General: He is not in acute distress.    Appearance: Normal appearance. He is well-developed.  HENT:      Head: Normocephalic and atraumatic.  Eyes:     Conjunctiva/sclera: Conjunctivae normal.     Pupils: Pupils are equal, round, and reactive to light.  Neck:     Musculoskeletal: Normal range of motion and neck supple. No neck rigidity.  Cardiovascular:     Rate and Rhythm: Normal rate.     Pulses: Normal pulses.     Heart sounds: Normal heart sounds.  Pulmonary:     Effort: Pulmonary effort is normal. No respiratory distress.  Musculoskeletal:     Right lower leg: No edema.     Left lower leg: No edema.     Comments: Cervical range of motion is actually fairly full side to side with some limitation of extension more than flexion.  Pain really with only left-sided rotation.  Trigger points palpated in the paraspinal musculature levator scapula rhomboids on the left.  No shoulder impingement significant.  Good strength in the upper extremities bilaterally with wrist extension long finger flexion and abduction.  Negative Hoffmann's test bilaterally.  Patient does ambulate without aid and is seemingly getting around better going from sit to stand.  Skin:    General: Skin is warm and dry.     Findings: No erythema or rash.  Neurological:     General: No focal deficit present.     Mental Status: He  is alert and oriented to person, place, and time.     Sensory: No sensory deficit.     Coordination: Coordination normal.     Gait: Gait normal.  Psychiatric:        Mood and Affect: Mood normal.        Behavior: Behavior normal.     Ortho Exam Imaging: No results found.  Past Medical/Family/Surgical/Social History: Medications & Allergies reviewed per EMR, new medications updated. Patient Active Problem List   Diagnosis Date Noted  . Cervical spondylosis without myelopathy 08/10/2018  . Spondylosis without myelopathy or radiculopathy, lumbar region 08/10/2018  . 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 Chronic bilateral low back pain without sciatica 04/18/2018   History reviewed. No  pertinent past medical history. History reviewed. No pertinent family history. History reviewed. No pertinent surgical history. Social History   Occupational History  . Not on file  Tobacco Use  . Smoking status: Former Research scientist (life sciences)  . Smokeless tobacco: Never Used  Substance and Sexual Activity  . Alcohol use: Not Currently  . Drug use: Not Currently  . Sexual activity: Not on file

## 2018-10-26 ENCOUNTER — Other Ambulatory Visit (INDEPENDENT_AMBULATORY_CARE_PROVIDER_SITE_OTHER): Payer: Self-pay | Admitting: Physical Medicine and Rehabilitation

## 2018-10-26 DIAGNOSIS — M542 Cervicalgia: Secondary | ICD-10-CM

## 2018-10-26 DIAGNOSIS — M7918 Myalgia, other site: Secondary | ICD-10-CM

## 2018-10-26 NOTE — Telephone Encounter (Signed)
Referral placed in Epic and faxed.

## 2018-11-27 ENCOUNTER — Other Ambulatory Visit: Payer: Self-pay

## 2018-11-27 ENCOUNTER — Encounter (HOSPITAL_BASED_OUTPATIENT_CLINIC_OR_DEPARTMENT_OTHER): Payer: Self-pay | Admitting: *Deleted

## 2018-11-27 NOTE — Progress Notes (Signed)
   11/27/18 [place]  Have you ever been diagnosed with sleep apnea through a sleep study? Yes  If yes, do you have and use a CPAP or BPAP machine every night? 1  Do you know the presssure settings on your maching? Yes  Do you snore loudly (loud enough to be heard through closed doors)?  1  Do you often feel tired, fatigued, or sleepy during the daytime (such as falling asleep during driving or talking to someone)? 0  Has anyone observed you stop breathing during your sleep? 1  Do you have, or are you being treated for high blood pressure? 1  BMI more than 35 kg/m2? 0  Age > 50 (1-yes) 1  Neck circumference greater than:Male 16 inches or larger, Male 17inches or larger? 0  Male Gender (Yes=1) 1  Obstructive Sleep Apnea Score 5  Score 5 or greater  Results sent to PCP

## 2018-11-29 ENCOUNTER — Encounter (HOSPITAL_BASED_OUTPATIENT_CLINIC_OR_DEPARTMENT_OTHER)
Admission: RE | Admit: 2018-11-29 | Discharge: 2018-11-29 | Disposition: A | Discharge status: Home / Self Care | Payer: Medicare Other | Source: Ambulatory Visit | Attending: Otolaryngology | Admitting: Otolaryngology

## 2018-11-29 ENCOUNTER — Other Ambulatory Visit: Payer: Self-pay

## 2018-11-29 ENCOUNTER — Encounter (HOSPITAL_COMMUNITY): Payer: Self-pay | Admitting: Anesthesiology

## 2018-11-29 ENCOUNTER — Encounter (HOSPITAL_BASED_OUTPATIENT_CLINIC_OR_DEPARTMENT_OTHER): Payer: Self-pay | Admitting: *Deleted

## 2018-11-29 DIAGNOSIS — R9431 Abnormal electrocardiogram [ECG] [EKG]: Secondary | ICD-10-CM | POA: Insufficient documentation

## 2018-11-29 DIAGNOSIS — Z01818 Encounter for other preprocedural examination: Secondary | ICD-10-CM | POA: Diagnosis present

## 2018-11-29 DIAGNOSIS — I10 Essential (primary) hypertension: Secondary | ICD-10-CM | POA: Diagnosis not present

## 2018-11-29 LAB — BASIC METABOLIC PANEL
Anion gap: 9 (ref 5–15)
BUN: 15 mg/dL (ref 8–23)
CO2: 25 mmol/L (ref 22–32)
Calcium: 9 mg/dL (ref 8.9–10.3)
Chloride: 101 mmol/L (ref 98–111)
Creatinine, Ser: 0.83 mg/dL (ref 0.61–1.24)
GFR calc Af Amer: >60 mL/min (ref >60)
GFR calc non Af Amer: >60 mL/min (ref >60)
GLUCOSE: 106 mg/dL — AB (ref 70–99)
Potassium: 4 mmol/L (ref 3.5–5.1)
Sodium: 135 mmol/L (ref 135–145)

## 2018-11-29 NOTE — Progress Notes (Signed)
EKG reviewed by Dr. Christella Hartigan, will proceed with surgery as scheduled.

## 2018-12-04 ENCOUNTER — Other Ambulatory Visit: Payer: Self-pay | Admitting: Otolaryngology

## 2018-12-05 ENCOUNTER — Ambulatory Visit (HOSPITAL_BASED_OUTPATIENT_CLINIC_OR_DEPARTMENT_OTHER): Admission: RE | Admit: 2018-12-05 | Payer: Medicare Other | Source: Home / Self Care | Admitting: Otolaryngology

## 2018-12-05 HISTORY — DX: Barrett's esophagus without dysplasia: K22.70

## 2018-12-05 HISTORY — DX: Malignant (primary) neoplasm, unspecified: C80.1

## 2018-12-05 HISTORY — DX: Dorsalgia, unspecified: M54.9

## 2018-12-05 HISTORY — DX: Alcohol abuse, uncomplicated: F10.10

## 2018-12-05 HISTORY — DX: Sleep apnea, unspecified: G47.30

## 2018-12-05 HISTORY — DX: Pure hypercholesterolemia, unspecified: E78.00

## 2018-12-05 HISTORY — DX: Anxiety disorder, unspecified: F41.9

## 2018-12-05 HISTORY — DX: Gastro-esophageal reflux disease without esophagitis: K21.9

## 2018-12-05 HISTORY — DX: Other chronic pain: G89.29

## 2018-12-05 HISTORY — DX: Essential (primary) hypertension: I10

## 2018-12-05 SURGERY — DRUG INDUCED SLEEP ENDOSCOPY
Anesthesia: General

## 2019-03-01 ENCOUNTER — Other Ambulatory Visit: Payer: Self-pay | Admitting: Otolaryngology

## 2019-03-08 ENCOUNTER — Other Ambulatory Visit: Payer: Self-pay

## 2019-03-08 ENCOUNTER — Encounter (HOSPITAL_BASED_OUTPATIENT_CLINIC_OR_DEPARTMENT_OTHER): Payer: Self-pay

## 2019-03-12 ENCOUNTER — Other Ambulatory Visit (HOSPITAL_COMMUNITY)
Admission: RE | Admit: 2019-03-12 | Discharge: 2019-03-12 | Disposition: A | Discharge status: Home / Self Care | Payer: Medicare Other | Source: Ambulatory Visit | Attending: Otolaryngology | Admitting: Otolaryngology

## 2019-03-12 DIAGNOSIS — Z1159 Encounter for screening for other viral diseases: Secondary | ICD-10-CM | POA: Diagnosis present

## 2019-03-12 LAB — SARS CORONAVIRUS 2 (TAT 6-24 HRS): SARS Coronavirus 2: NEGATIVE

## 2019-03-13 NOTE — Progress Notes (Signed)
Pt called surgery center on Monday 6/22 stating that he woke up with a sore throat. Pt instructed to call [facility] office, and to proceed with covid test Tuesday 6/23. Pt verbalized understanding.  I contacted patient today to let him know that covid test is negative. Pt states he only had sore throat for one day, and is feeling much better. Dr Fransisco Beau ok to proceed with surgery as scheduled.

## 2019-03-15 ENCOUNTER — Other Ambulatory Visit: Payer: Self-pay

## 2019-03-15 ENCOUNTER — Encounter (HOSPITAL_BASED_OUTPATIENT_CLINIC_OR_DEPARTMENT_OTHER)
Admission: RE | Disposition: A | Discharge status: Home / Self Care | Payer: Self-pay | Source: Home / Self Care | Attending: Otolaryngology

## 2019-03-15 ENCOUNTER — Ambulatory Visit (HOSPITAL_BASED_OUTPATIENT_CLINIC_OR_DEPARTMENT_OTHER): Payer: Medicare Other | Admitting: Anesthesiology

## 2019-03-15 ENCOUNTER — Ambulatory Visit (HOSPITAL_BASED_OUTPATIENT_CLINIC_OR_DEPARTMENT_OTHER)
Admission: RE | Admit: 2019-03-15 | Discharge: 2019-03-15 | Disposition: A | Discharge status: Home / Self Care | Payer: Medicare Other | Attending: Otolaryngology | Admitting: Otolaryngology

## 2019-03-15 ENCOUNTER — Encounter (HOSPITAL_BASED_OUTPATIENT_CLINIC_OR_DEPARTMENT_OTHER): Payer: Self-pay | Admitting: *Deleted

## 2019-03-15 DIAGNOSIS — E669 Obesity, unspecified: Secondary | ICD-10-CM | POA: Insufficient documentation

## 2019-03-15 DIAGNOSIS — Z6829 Body mass index (BMI) 29.0-29.9, adult: Secondary | ICD-10-CM | POA: Diagnosis not present

## 2019-03-15 DIAGNOSIS — K219 Gastro-esophageal reflux disease without esophagitis: Secondary | ICD-10-CM | POA: Diagnosis not present

## 2019-03-15 DIAGNOSIS — Z8546 Personal history of malignant neoplasm of prostate: Secondary | ICD-10-CM | POA: Diagnosis not present

## 2019-03-15 DIAGNOSIS — Z87891 Personal history of nicotine dependence: Secondary | ICD-10-CM | POA: Insufficient documentation

## 2019-03-15 DIAGNOSIS — G4733 Obstructive sleep apnea (adult) (pediatric): Secondary | ICD-10-CM | POA: Diagnosis not present

## 2019-03-15 DIAGNOSIS — K2270 Barrett's esophagus without dysplasia: Secondary | ICD-10-CM | POA: Diagnosis not present

## 2019-03-15 DIAGNOSIS — M549 Dorsalgia, unspecified: Secondary | ICD-10-CM | POA: Diagnosis not present

## 2019-03-15 DIAGNOSIS — Z79899 Other long term (current) drug therapy: Secondary | ICD-10-CM | POA: Insufficient documentation

## 2019-03-15 DIAGNOSIS — G8929 Other chronic pain: Secondary | ICD-10-CM | POA: Insufficient documentation

## 2019-03-15 DIAGNOSIS — E785 Hyperlipidemia, unspecified: Secondary | ICD-10-CM | POA: Diagnosis not present

## 2019-03-15 DIAGNOSIS — I10 Essential (primary) hypertension: Secondary | ICD-10-CM | POA: Diagnosis not present

## 2019-03-15 DIAGNOSIS — F419 Anxiety disorder, unspecified: Secondary | ICD-10-CM | POA: Insufficient documentation

## 2019-03-15 DIAGNOSIS — E7800 Pure hypercholesterolemia, unspecified: Secondary | ICD-10-CM | POA: Diagnosis not present

## 2019-03-15 HISTORY — PX: DRUG INDUCED ENDOSCOPY: SHX6808

## 2019-03-15 SURGERY — DRUG INDUCED SLEEP ENDOSCOPY
Anesthesia: Monitor Anesthesia Care | Site: Nose

## 2019-03-15 MED ORDER — CHLORHEXIDINE GLUCONATE CLOTH 2 % EX PADS: 6.0000 | MEDICATED_PAD | Freq: Once | CUTANEOUS | Status: DC

## 2019-03-15 MED ORDER — LACTATED RINGERS IV SOLN
INTRAVENOUS | Status: DC | PRN
  Administered 2019-03-15: 07:00:00 via INTRAVENOUS

## 2019-03-15 MED ORDER — ACETAMINOPHEN 500 MG PO TABS
1000.0000 mg | ORAL_TABLET | Freq: Once | ORAL | Status: AC
  Administered 2019-03-15: 1000 mg via ORAL

## 2019-03-15 MED ORDER — ONDANSETRON HCL 4 MG/2ML IJ SOLN: 4.0000 mg | Freq: Once | INTRAMUSCULAR | Status: DC | PRN

## 2019-03-15 MED ORDER — BACITRACIN ZINC 500 UNIT/GM EX OINT
TOPICAL_OINTMENT | CUTANEOUS | Status: AC
  Filled 2019-03-15: qty 0.9

## 2019-03-15 MED ORDER — FENTANYL CITRATE (PF) 100 MCG/2ML IJ SOLN: 25.0000 ug | INTRAMUSCULAR | Status: DC | PRN

## 2019-03-15 MED ORDER — PROPOFOL 10 MG/ML IV BOLUS
INTRAVENOUS | Status: DC | PRN
  Administered 2019-03-15 (×3): 10 mg via INTRAVENOUS

## 2019-03-15 MED ORDER — OXYMETAZOLINE HCL 0.05 % NA SOLN
NASAL | Status: AC
  Filled 2019-03-15: qty 30

## 2019-03-15 MED ORDER — PROPOFOL 500 MG/50ML IV EMUL
INTRAVENOUS | Status: DC | PRN
  Administered 2019-03-15: 75 ug/kg/min via INTRAVENOUS

## 2019-03-15 MED ORDER — LIDOCAINE-EPINEPHRINE 1 %-1:100000 IJ SOLN
INTRAMUSCULAR | Status: AC
  Filled 2019-03-15: qty 1

## 2019-03-15 MED ORDER — LIDOCAINE 2% (20 MG/ML) 5 ML SYRINGE
INTRAMUSCULAR | Status: DC | PRN
  Administered 2019-03-15: 60 mg via INTRAVENOUS

## 2019-03-15 MED ORDER — ACETAMINOPHEN 500 MG PO TABS
ORAL_TABLET | ORAL | Status: AC
  Filled 2019-03-15: qty 2

## 2019-03-15 SURGICAL SUPPLY — 16 items
CANISTER SUCT 1200ML W/VALVE (MISCELLANEOUS) ×3 IMPLANT
COVER WAND RF STERILE (DRAPES) IMPLANT
GLOVE BIO SURGEON STRL SZ 6.5 (GLOVE) ×1 IMPLANT
GLOVE BIO SURGEONS STRL SZ 6.5 (GLOVE) ×1
GLOVE BIOGEL M 7.0 STRL (GLOVE) ×3 IMPLANT
NDL PRECISIONGLIDE 27X1.5 (NEEDLE) IMPLANT
NEEDLE PRECISIONGLIDE 27X1.5 (NEEDLE) IMPLANT
PACK BASIN DAY SURGERY FS (CUSTOM PROCEDURE TRAY) ×3 IMPLANT
PATTIES SURGICAL .5 X3 (DISPOSABLE) IMPLANT
SHEET MEDIUM DRAPE 40X70 STRL (DRAPES) ×3 IMPLANT
SOLUTION ANTI FOG 6CC (MISCELLANEOUS) ×3 IMPLANT
SPONGE NEURO XRAY DETECT 1X3 (DISPOSABLE) IMPLANT
SYR CONTROL 10ML LL (SYRINGE) IMPLANT
TOWEL GREEN STERILE FF (TOWEL DISPOSABLE) ×3 IMPLANT
TUBE CONNECTING 20'X1/4 (TUBING)
TUBE CONNECTING 20X1/4 (TUBING) IMPLANT

## 2019-03-15 NOTE — Op Note (Signed)
Operative Note: DRUG INDUCED SLEEP ENDOSCOPY  Patient: Ricky Zavala  Medical record number: [id number]  Date:03/15/2019  Pre-operative Indications: 1.  Obstructive Sleep Apnea  Postoperative Indications: Same  Surgical Procedure: 1.  Drug Induced Sleep Endoscopy (DISE)  Anesthesia: MAC with IV sedation  Surgeon: Delsa Bern, M.D.  Complications: None  EBL: None  Findings: There is no evidence of complete concentric palatal obstruction.  Anatomically the patient should be a candidate for hypoglossal nerve stimulation therapy.     Brief History: The patient is a 78 y.o. male with a history of obstructive sleep apnea. The patient has undergone previous sleep study which showed mild to moderate levels of obstructive sleep apnea.  The patient was prescribed CPAP which they consistently attempted to use without success.  Given the patient's history and findings, the above drug-induced sleep endoscopy was recommended to assess the patient's anatomic level of apnea.  Risks and benefits were discussed in detail with the patient today understand and agree with our plan for surgery which is scheduled at [facility] under sedated anesthesia as an outpatient.  Surgical Procedure: The patient is brought to the operating room on 03/15/2019 and placed in supine position on the operating table.  Intravenous sedated anesthesia was established without difficulty using the standard drug-induced sleep endoscopy protocol. When the patient was adequately anesthetized, surgical timeout was performed and correct identification of the patient and the surgical procedure.    A propofol infusion was administered and the patient was monitored carefully to achieve a level of sedation appropriate for DISE.  The patient did not respond to verbal commands but still had spontaneous respiration, sleep disordered breathing and associated desaturations were observed.  With the patient under adequate sedated anesthesia the  flexible nasal laryngoscope was passed without difficulty.  The patient's nasal cavity showed significant bilateral nasal airway obstruction from deviated septum and turbinate hypertrophy.  The endoscope was then passed to visualize the velopharynx, oropharynx, tongue base and epiglottis to assess areas of obstruction.  Patient's airway showed anterior to posterior obstruction at the level of the soft palate and base of tongue.   There was no evidence of complete concentric palatal obstruction and the patient appeared to be a candidate anatomically for hypoglossal nerve stimulation therapy.  Surgical sponge count was correct. Patient was awakened from anesthetic and transferred from the operating room to the recovery room in stable condition. There were no complications and no blood loss.   Delsa Bern, M.D. [facility] 03/15/2019

## 2019-03-15 NOTE — Discharge Instructions (Signed)
°  Post Anesthesia Home Care Instructions  NO TYLENOL UNTIL AFTER 1:00PM ON 03/15/19.  Activity: Get plenty of rest for the remainder of the day. A responsible individual must stay with you for 24 hours following the procedure.  For the next 24 hours, DO NOT: -Drive a car -Paediatric nurse -Drink alcoholic beverages -Take any medication unless instructed by your physician -Make any legal decisions or sign important papers.  Meals: Start with liquid foods such as gelatin or soup. Progress to regular foods as tolerated. Avoid greasy, spicy, heavy foods. If nausea and/or vomiting occur, drink only clear liquids until the nausea and/or vomiting subsides. Call your physician if vomiting continues.  Special Instructions/Symptoms: Your throat may feel dry or sore from the anesthesia or the breathing tube placed in your throat during surgery. If this causes discomfort, gargle with warm salt water. The discomfort should disappear within 24 hours.  If you had a scopolamine patch placed behind your ear for the management of post- operative nausea and/or vomiting:  1. The medication in the patch is effective for 72 hours, after which it should be removed.  Wrap patch in a tissue and discard in the trash. Wash hands thoroughly with soap and water. 2. You may remove the patch earlier than 72 hours if you experience unpleasant side effects which may include dry mouth, dizziness or visual disturbances. 3. Avoid touching the patch. Wash your hands with soap and water after contact with the patch.

## 2019-03-15 NOTE — H&P (Signed)
Ricky Zavala is an 78 y.o. male.   Chief Complaint: OSA HPI: Hx of OSA intolerant of CPAP  Past Medical History:  Diagnosis Date  . Anxiety   . Barrett's esophagus   . Cancer [facility])    prostate cancer-had surgery  . Chronic back pain   . ETOH abuse    daily 2-3-vodka drinks  . GERD (gastroesophageal reflux disease)   . Hypercholesteremia   . Hypertension   . Sleep apnea    severe OSA uses CPAP nightly AHI 37.6    Past Surgical History:  Procedure Laterality Date  . APPENDECTOMY    . CARDIAC CATHETERIZATION     care everywhere- August 2019  . COLONOSCOPY    . GANGLION CYST EXCISION Left    wrist  . PROSTATECTOMY      History reviewed. No pertinent family history. Social History:  reports that he quit smoking about 25 years ago. He smoked 3.00 packs per day. He has never used smokeless tobacco. He reports current alcohol use. He reports previous drug use.  Allergies: No Known Allergies  Medications Prior to Admission  Medication Sig Dispense Refill  . ALPRAZolam (XANAX) 0.5 MG tablet Take by mouth 2 (two) times daily.     Marland Kitchen amLODipine (NORVASC) 5 MG tablet Take by mouth.    . cyanocobalamin 1000 MCG tablet Take by mouth.    . ergocalciferol (VITAMIN D2) 50000 units capsule Take by mouth.    . folic acid (FOLVITE) 1 MG tablet Take by mouth.    . labetalol (NORMODYNE) 200 MG tablet     . latanoprost (XALATAN) 0.005 % ophthalmic solution     . lisinopril-hydrochlorothiazide (PRINZIDE,ZESTORETIC) 20-25 MG tablet Take by mouth.    . Multiple Vitamins-Minerals (PRESERVISION AREDS 2 PO) Take by mouth.    Marland Kitchen omeprazole (PRILOSEC) 40 MG capsule Take by mouth.    . spironolactone (ALDACTONE) 25 MG tablet Take by mouth.    . zolpidem (AMBIEN) 10 MG tablet Take by mouth.    . simvastatin (ZOCOR) 40 MG tablet Take by mouth.      No results found for this or any previous visit (from the past 48 hour(s)). No results found.  Review of Systems  Constitutional: Negative.   HENT:  Negative.   Respiratory: Negative.   Cardiovascular: Negative.     Blood pressure (!) 177/77, temperature 98.4 F (36.9 C), temperature source Oral, resp. rate 16, height 5\' 11"  (1.803 m), weight 96.8 kg, SpO2 99 %. Physical Exam  Constitutional: He appears well-developed and well-nourished.  Neck: Normal range of motion. Neck supple.  Cardiovascular: Normal rate.  Respiratory: Effort normal.     Assessment/Plan Adm for OP DISE  Jerrell Belfast, MD 03/15/2019, 7:27 AM

## 2019-03-15 NOTE — Transfer of Care (Signed)
Immediate Anesthesia Transfer of Care Note  Patient: Ricky Zavala  Procedure(s) Performed: DRUG INDUCED ENDOSCOPY (N/A Nose)  Patient Location: PACU  Anesthesia Type:MAC  Level of Consciousness: awake, alert  and oriented  Airway & Oxygen Therapy: Patient Spontanous Breathing and Patient connected to nasal cannula oxygen  Post-op Assessment: Report given to RN and Post -op Vital signs reviewed and stable  Post vital signs: Reviewed and stable  Last Vitals:  Vitals Value Taken Time  BP 154/80   Temp    Pulse 62 03/15/19 0753  Resp 20 03/15/19 0753  SpO2 100 % 03/15/19 0753  Vitals shown include unvalidated device data.  Last Pain:  Vitals:   03/15/19 0633  TempSrc: Oral  PainSc: 0-No pain      Patients Stated Pain Goal: 0 (30/05/11 0211)  Complications: No apparent anesthesia complications

## 2019-03-15 NOTE — Anesthesia Preprocedure Evaluation (Addendum)
Anesthesia Evaluation  Patient identified by MRN, date of birth, ID band Patient awake    Reviewed: Allergy & Precautions, NPO status , Patient's Chart, lab work & pertinent test results, reviewed documented beta blocker date and time   Airway Mallampati: IV  TM Distance: >3 FB Neck ROM: Full    Dental no notable dental hx.    Pulmonary sleep apnea and Continuous Positive Airway Pressure Ventilation , former smoker,    Pulmonary exam normal breath sounds clear to auscultation       Cardiovascular hypertension, Pt. on medications and Pt. on home beta blockers Normal cardiovascular exam Rhythm:Regular Rate:Normal  ECG: NSR, rate 63   Neuro/Psych PSYCHIATRIC DISORDERS Anxiety negative neurological ROS     GI/Hepatic GERD  Medicated and Controlled,(+)     substance abuse  alcohol use,   Endo/Other  negative endocrine ROS  Renal/GU negative Renal ROS     Musculoskeletal Chronic back pain   Abdominal (+) + obese,   Peds  Hematology HLD   Anesthesia Other Findings Obstructive sleep apnea  Reproductive/Obstetrics                            Anesthesia Physical Anesthesia Plan  ASA: III  Anesthesia Plan: MAC   Post-op Pain Management:    Induction: Intravenous  PONV Risk Score and Plan: 1 and Propofol infusion and Treatment may vary due to age or medical condition  Airway Management Planned: Natural Airway  Additional Equipment:   Intra-op Plan:   Post-operative Plan:   Informed Consent: I have reviewed the patients History and Physical, chart, labs and discussed the procedure including the risks, benefits and alternatives for the proposed anesthesia with the patient or authorized representative who has indicated his/her understanding and acceptance.     Dental advisory given  Plan Discussed with: CRNA  Anesthesia Plan Comments:         Anesthesia Quick Evaluation

## 2019-03-15 NOTE — Anesthesia Postprocedure Evaluation (Signed)
Anesthesia Post Note  Patient: Ricky Zavala  Procedure(s) Performed: DRUG INDUCED ENDOSCOPY (N/A Nose)     Patient location during evaluation: PACU Anesthesia Type: MAC Level of consciousness: awake and alert Pain management: pain level controlled Vital Signs Assessment: post-procedure vital signs reviewed and stable Respiratory status: spontaneous breathing, nonlabored ventilation, respiratory function stable and patient connected to nasal cannula oxygen Cardiovascular status: stable and blood pressure returned to baseline Postop Assessment: no apparent nausea or vomiting Anesthetic complications: no    Last Vitals:  Vitals:   03/15/19 0830 03/15/19 0845  BP: (!) 152/87 (!) 170/86  Pulse: 61 62  Resp: 11 18  Temp:  36.7 C  SpO2: 96% 98%    Last Pain:  Vitals:   03/15/19 0845  TempSrc:   PainSc: 0-No pain                 Ryan P Ellender

## 2019-03-18 ENCOUNTER — Encounter (HOSPITAL_BASED_OUTPATIENT_CLINIC_OR_DEPARTMENT_OTHER): Payer: Self-pay | Admitting: Otolaryngology

## 2019-05-15 ENCOUNTER — Other Ambulatory Visit: Payer: Self-pay | Admitting: Otolaryngology

## 2019-06-02 NOTE — Progress Notes (Signed)
[place], [place] - [address] [place] [address] Phone: [phone] Fax: [phone]      Your procedure is scheduled on June 05, 2019.  Report to [facility] Main Entrance "A" at 08:30 A.M., and check in at the Admitting office.  Call this number if you have problems the morning of surgery:  [phone]  Call [phone] if you have any questions prior to your surgery date Monday-Friday 8am-4pm    Remember:  Do not eat or drink after midnight the night before your surgery    Take these medicines the morning of surgery with A SIP OF WATER : Alprazolam (Xanax)  Amlodipine (Norvasc) Labetalol (Normodyne) Eye drops Omeprazole (Prilosec) Simvastatin (Zocor)  7 days prior to surgery STOP taking any Aspirin (unless otherwise instructed by your surgeon), Aleve, Naproxen, Ibuprofen, Motrin, Advil, Goody's, BC's, all herbal medications, fish oil, and all vitamins.    The Morning of Surgery  Do not wear jewelry.  Do not wear lotions, powders, or perfumes/colognes, or deodorant  Do not shave 48 hours prior to surgery.  Men may shave face and neck.  Do not bring valuables to the hospital.  [facility] is not responsible for any belongings or valuables.  If you are a smoker, DO NOT Smoke 24 hours prior to surgery IF you wear a CPAP at night please bring your mask, tubing, and machine the morning of surgery   Remember that you must have someone to transport you home after your surgery, and remain with you for 24 hours if you are discharged the same day.   Contacts, glasses, hearing aids, dentures or bridgework may not be worn into surgery.   Leave your suitcase in the car.  After surgery it may be brought to your room.  For patients admitted to the hospital, discharge time will be determined by your treatment team.  Patients discharged the day of surgery will not be allowed to drive home.    Special instructions:   [facility]- Preparing For Surgery  Before surgery, you can play an important role. Because skin is not sterile, your skin needs to be as free of germs as possible. You can reduce the number of germs on your skin by washing with CHG (chlorahexidine gluconate) Soap before surgery.  CHG is an antiseptic cleaner which kills germs and bonds with the skin to continue killing germs even after washing.    Oral Hygiene is also important to reduce your risk of infection.  Remember - BRUSH YOUR TEETH THE MORNING OF SURGERY WITH YOUR REGULAR TOOTHPASTE  Please do not use if you have an allergy to CHG or antibacterial soaps. If your skin becomes reddened/irritated stop using the CHG.  Do not shave (including legs and underarms) for at least 48 hours prior to first CHG shower. It is OK to shave your face.  Please follow these instructions carefully.   1. Shower the NIGHT BEFORE SURGERY and the MORNING OF SURGERY with CHG Soap.   2. If you chose to wash your hair, wash your hair first as usual with your normal shampoo.  3. After you shampoo, rinse your hair and body thoroughly to remove the shampoo.  4. Use CHG as you would any other liquid soap. You can apply CHG directly to the skin and wash gently with a scrungie or a clean washcloth.   5. Apply the CHG Soap to your body ONLY FROM THE NECK DOWN.  Do not  use on open wounds or open sores. Avoid contact with your eyes, ears, mouth and genitals (private parts). Wash Face and genitals (private parts)  with your normal soap.   6. Wash thoroughly, paying special attention to the area where your surgery will be performed.  7. Thoroughly rinse your body with warm water from the neck down.  8. DO NOT shower/wash with your normal soap after using and rinsing off the CHG Soap.  9. Pat yourself dry with a CLEAN TOWEL.  10. Wear CLEAN PAJAMAS to bed the night before surgery, wear comfortable clothes the morning of surgery  11. Place CLEAN SHEETS on your bed the  night of your first shower and DO NOT SLEEP WITH PETS.    Day of Surgery:  Do not apply any deodorants/lotions. Please shower the morning of surgery with the CHG soap  Please wear clean clothes to the hospital/surgery center.   Remember to brush your teeth WITH YOUR REGULAR TOOTHPASTE.   Please read over the following fact sheets that you were given.

## 2019-06-03 ENCOUNTER — Encounter (HOSPITAL_COMMUNITY): Payer: Self-pay

## 2019-06-03 ENCOUNTER — Encounter (HOSPITAL_COMMUNITY)
Admission: RE | Admit: 2019-06-03 | Discharge: 2019-06-03 | Disposition: A | Discharge status: Home / Self Care | Payer: Medicare Other | Source: Ambulatory Visit | Attending: Otolaryngology | Admitting: Otolaryngology

## 2019-06-03 ENCOUNTER — Other Ambulatory Visit (HOSPITAL_COMMUNITY)
Admission: RE | Admit: 2019-06-03 | Discharge: 2019-06-03 | Disposition: A | Discharge status: Home / Self Care | Payer: Medicare Other | Source: Ambulatory Visit | Attending: Otolaryngology | Admitting: Otolaryngology

## 2019-06-03 ENCOUNTER — Other Ambulatory Visit: Payer: Self-pay

## 2019-06-03 DIAGNOSIS — Z01818 Encounter for other preprocedural examination: Secondary | ICD-10-CM | POA: Insufficient documentation

## 2019-06-03 HISTORY — DX: Chronic obstructive pulmonary disease, unspecified: J44.9

## 2019-06-03 LAB — COMPREHENSIVE METABOLIC PANEL
ALT: 16 U/L (ref 0–44)
AST: 18 U/L (ref 15–41)
Albumin: 3.6 g/dL (ref 3.5–5.0)
Alkaline Phosphatase: 35 U/L — ABNORMAL LOW (ref 38–126)
Anion gap: 10 (ref 5–15)
BUN: 20 mg/dL (ref 8–23)
CO2: 28 mmol/L (ref 22–32)
Calcium: 9 mg/dL (ref 8.9–10.3)
Chloride: 100 mmol/L (ref 98–111)
Creatinine, Ser: 0.99 mg/dL (ref 0.61–1.24)
GFR calc Af Amer: >60 mL/min (ref >60)
GFR calc non Af Amer: >60 mL/min (ref >60)
Glucose, Bld: 109 mg/dL — ABNORMAL HIGH (ref 70–99)
Potassium: 3.7 mmol/L (ref 3.5–5.1)
Sodium: 138 mmol/L (ref 135–145)
Total Bilirubin: 0.8 mg/dL (ref 0.3–1.2)
Total Protein: 6.3 g/dL — ABNORMAL LOW (ref 6.5–8.1)

## 2019-06-03 LAB — CBC
HCT: 40.3 % (ref 39.0–52.0)
Hemoglobin: 13.3 g/dL (ref 13.0–17.0)
MCH: 32.7 pg (ref 26.0–34.0)
MCHC: 33 g/dL (ref 30.0–36.0)
MCV: 99 fL (ref 80.0–100.0)
Platelets: 205 10*3/uL (ref 150–400)
RBC: 4.07 MIL/uL — ABNORMAL LOW (ref 4.22–5.81)
RDW: 13.1 % (ref 11.5–15.5)
WBC: 7.6 10*3/uL (ref 4.0–10.5)
nRBC: 0 % (ref 0.0–0.2)

## 2019-06-03 LAB — SARS CORONAVIRUS 2 (TAT 6-24 HRS): SARS Coronavirus 2: NEGATIVE

## 2019-06-03 NOTE — Progress Notes (Signed)
[place], [place] - [address] [place] [address] Phone: [phone] Fax: [phone]      Your procedure is scheduled on June 05, 2019.  Report to [facility] Main Entrance "A" at 08:30 A.M., and check in at the Admitting office.  Call this number if you have problems the morning of surgery:  [phone]  Call [phone] if you have any questions prior to your surgery date Monday-Friday 8am-4pm    Remember:  Do not eat or drink after midnight the night before your surgery    Take these medicines the morning of surgery with A SIP OF WATER : Alprazolam (Xanax)  Amlodipine (Norvasc) Labetalol (Normodyne) Eye drops Omeprazole (Prilosec)   7 days prior to surgery STOP taking any Aspirin (unless otherwise instructed by your surgeon), Aleve, Naproxen, Ibuprofen, Motrin, Advil, Goody's, BC's, all herbal medications, fish oil, and all vitamins.    The Morning of Surgery  Do not wear jewelry.  Do not wear lotions, powders, or perfumes/colognes, or deodorant  Do not shave 48 hours prior to surgery.  Men may shave face and neck.  Do not bring valuables to the hospital.  [facility] is not responsible for any belongings or valuables.  If you are a smoker, DO NOT Smoke 24 hours prior to surgery IF you wear a CPAP at night please bring your mask, tubing, and machine the morning of surgery   Remember that you must have someone to transport you home after your surgery, and remain with you for 24 hours if you are discharged the same day.   Contacts, glasses, hearing aids, dentures or bridgework may not be worn into surgery.   Leave your suitcase in the car.  After surgery it may be brought to your room.  For patients admitted to the hospital, discharge time will be determined by your treatment team.  Patients discharged the day of surgery will not be allowed to drive home.    Special instructions:   [facility]- Preparing  For Surgery  Before surgery, you can play an important role. Because skin is not sterile, your skin needs to be as free of germs as possible. You can reduce the number of germs on your skin by washing with CHG (chlorahexidine gluconate) Soap before surgery.  CHG is an antiseptic cleaner which kills germs and bonds with the skin to continue killing germs even after washing.    Oral Hygiene is also important to reduce your risk of infection.  Remember - BRUSH YOUR TEETH THE MORNING OF SURGERY WITH YOUR REGULAR TOOTHPASTE  Please do not use if you have an allergy to CHG or antibacterial soaps. If your skin becomes reddened/irritated stop using the CHG.  Do not shave (including legs and underarms) for at least 48 hours prior to first CHG shower. It is OK to shave your face.  Please follow these instructions carefully.   1. Shower the NIGHT BEFORE SURGERY and the MORNING OF SURGERY with CHG Soap.   2. If you chose to wash your hair, wash your hair first as usual with your normal shampoo.  3. After you shampoo, rinse your hair and body thoroughly to remove the shampoo.  4. Use CHG as you would any other liquid soap. You can apply CHG directly to the skin and wash gently with a scrungie or a clean washcloth.   5. Apply the CHG Soap to your body ONLY FROM THE NECK DOWN.  Do not use  on open wounds or open sores. Avoid contact with your eyes, ears, mouth and genitals (private parts). Wash Face and genitals (private parts)  with your normal soap.   6. Wash thoroughly, paying special attention to the area where your surgery will be performed.  7. Thoroughly rinse your body with warm water from the neck down.  8. DO NOT shower/wash with your normal soap after using and rinsing off the CHG Soap.  9. Pat yourself dry with a CLEAN TOWEL.  10. Wear CLEAN PAJAMAS to bed the night before surgery, wear comfortable clothes the morning of surgery  11. Place CLEAN SHEETS on your bed the night of your first  shower and DO NOT SLEEP WITH PETS.    Day of Surgery:  Do not apply any deodorants/lotions. Please shower the morning of surgery with the CHG soap  Please wear clean clothes to the hospital/surgery center.   Remember to brush your teeth WITH YOUR REGULAR TOOTHPASTE.   Please read over the following fact sheets that you were given.

## 2019-06-03 NOTE — Progress Notes (Signed)
PCP - Egbert Garibaldi, PA-C Cardiologist - Dr. Nonie Hoyer  Chest x-ray - N/A EKG - 06/03/19 Stress Test - 01/31/19 ECHO - 01/31/19 Cardiac Cath - 05/07/18  Sleep Study - + OSA CPAP - uses QHS, pressure setting 5.0  Aspirin Instructions: Patient instructed to hold all Aspirin, NSAID's, herbal medications, fish oil and vitamins 7 days prior to surgery.   Anesthesia review: cardiac history  Patient denies shortness of breath, fever, cough and chest pain at PAT appointment   Patient verbalized understanding of instructions that were given to them at the PAT appointment. Patient was also instructed that they will need to review over the PAT instructions again at home before surgery.

## 2019-06-04 NOTE — Anesthesia Preprocedure Evaluation (Addendum)
Anesthesia Evaluation  Patient identified by MRN, date of birth, ID band Patient awake    Reviewed: Allergy & Precautions, NPO status , Patient's Chart, lab work & pertinent test results, reviewed documented beta blocker date and time   History of Anesthesia Complications Negative for: history of anesthetic complications  Airway Mallampati: II  TM Distance: >3 FB Neck ROM: Full    Dental no notable dental hx.    Pulmonary neg pulmonary ROS, sleep apnea and Continuous Positive Airway Pressure Ventilation , COPD, former smoker,    Pulmonary exam normal breath sounds clear to auscultation       Cardiovascular hypertension, Pt. on medications and Pt. on home beta blockers negative cardio ROS Normal cardiovascular exam Rhythm:Regular Rate:Normal     Neuro/Psych Anxiety negative neurological ROS  negative psych ROS   GI/Hepatic negative GI ROS, Neg liver ROS, GERD  Controlled and Medicated,  Endo/Other  negative endocrine ROS  Renal/GU negative Renal ROS  negative genitourinary   Musculoskeletal negative musculoskeletal ROS (+)   Abdominal   Peds negative pediatric ROS (+)  Hematology negative hematology ROS (+)   Anesthesia Other Findings Day of surgery medications reviewed with patient.  Reproductive/Obstetrics negative OB ROS                           Anesthesia Physical Anesthesia Plan  ASA: II  Anesthesia Plan: General   Post-op Pain Management:    Induction: Intravenous  PONV Risk Score and Plan: 3 and Treatment may vary due to age or medical condition, Ondansetron and Dexamethasone  Airway Management Planned: Oral ETT  Additional Equipment: None  Intra-op Plan:   Post-operative Plan: Extubation in OR  Informed Consent:   Plan Discussed with:   Anesthesia Plan Comments: (PAT note written 06/04/2019 by Myra Gianotti, PA-C. )       Anesthesia Quick Evaluation

## 2019-06-04 NOTE — Progress Notes (Signed)
Anesthesia Chart Review:  Case: [id number] Date/Time: 06/05/19 1015   Procedure: IMPLANTATION OF HYPOGLOSSAL NERVE STIMULATOR (N/A )   Anesthesia type: General   Pre-op diagnosis: obstructive sleep apnea G47.33   Location: [facility] OR ROOM 08 / [facility]   Surgeon: Jerrell Belfast, MD      DISCUSSION: Patient is a 78 year old male scheduled for the above procedure. He is s/p drug induced sleep endoscopy 03/15/19.   History includes former smoker (quit 1995), OSA, HTN, GERD, hypercholesterolemia, COPD, prostate cancer (s/p prostatectomy), chronic back pain. Documented alcohol abuse with 2-3 vodka drinks nightly. Only 25% LAD and RCA disease on LHC in 2019. Incomplete LBBB on 06/03/19 EKG--which was noted on 01/29/18 [facility] tracing. BMI is consistent with obesity.   06/03/19 COVID-19 test is negative.  If no acute changes and I would anticipate that he could proceed as planned.  VS: BP (!) 174/87   Pulse 70   Temp 37.3 C (Oral)   Resp 18   Ht 5\' 11"  (1.803 m)   Wt 101.2 kg   SpO2 97%   BMI 31.10 kg/m    PROVIDERS: Egbert Garibaldi, PA-C is PCP Nonie Hoyer, MD is cardiologist ([place]). Seen in 2019 after hospitalization for chest pain. Stress, echo and cath below. Only mild CAD by cath.    LABS: Labs reviewed: Acceptable for surgery. (all labs ordered are listed, but only abnormal results are displayed)  Labs Reviewed  COMPREHENSIVE METABOLIC PANEL - Abnormal; Notable for the following components:      Result Value   Glucose, Bld 109 (*)    Total Protein 6.3 (*)    Alkaline Phosphatase 35 (*)    All other components within normal limits  CBC - Abnormal; Notable for the following components:   RBC 4.07 (*)    All other components within normal limits     EKG: 06/03/19: Normal sinus rhythm Incomplete left bundle branch block Nonspecific ST abnormality Abnormal ECG No significant change since last tracing Confirmed by Ena Dawley [phone]) on 06/03/2019 5:20:15  PM   CV: Cardiac cath 05/07/18 ([facility]): Coronary anatomy:  1. Left Main coronary artery: Normal 2. Left anterior descending artery: Mid mild 25% disease. D1 is a medium caliber bifurcating vessel without any stenosis 3. Left circumflex artery: Large, normal. OM branch is free of disease 4. Right coronary artery: Large caliber dominant vessel which has a proximal smooth long 25% disease.   LV gram performed showed normal left ventricular systolic function with left ventricular ejection fraction of more than 55%. Trace mitral regurgitation noted. No significant gradient across the aortic valve on pullback. CONCLUSIONS: 1. Mild nonobstructive CAD with normal LVEF.    Nuclear stress test 01/30/18 ([facility]): CONCLUSIONS: This study is negative for myocardial ischemia with visually normal LVEF.   Echo 01/30/18 ([facility]): Interpretation Summary A complete two-dimensional transthoracic echocardiogram was performed (2D, M-mode, Doppler and color flow Doppler). Ejection Fraction = >55%. The transmitral spectral Doppler flow pattern is suggestive of impaired LV relaxation. There is mild mitral regurgitation.   Past Medical History:  Diagnosis Date  . Anxiety   . Barrett's esophagus   . Cancer [facility])    prostate cancer-had surgery  . Chronic back pain   . COPD (chronic obstructive pulmonary disease) ([place])   . ETOH abuse    daily 2-3-vodka drinks  . GERD (gastroesophageal reflux disease)   . Hypercholesteremia   . Hypertension   . Sleep apnea    severe OSA uses CPAP  nightly AHI 37.6    Past Surgical History:  Procedure Laterality Date  . APPENDECTOMY    . CARDIAC CATHETERIZATION     care everywhere- August 2019  . COLONOSCOPY    . DRUG INDUCED ENDOSCOPY N/A 03/15/2019   Procedure: DRUG INDUCED ENDOSCOPY;  Surgeon: Jerrell Belfast, MD;  Location: [place];  Service: ENT;  Laterality: N/A;  Drug induced sleep endoscopy  . EYE SURGERY Bilateral   .  GANGLION CYST EXCISION Left    wrist  . LASIK Right   . PROSTATECTOMY    . TONSILLECTOMY      MEDICATIONS: . ALPRAZolam (XANAX) 0.5 MG tablet  . amLODipine (NORVASC) 5 MG tablet  . cyanocobalamin 1000 MCG tablet  . ergocalciferol (VITAMIN D2) 50000 units capsule  . folic acid (FOLVITE) 1 MG tablet  . labetalol (NORMODYNE) 200 MG tablet  . latanoprost (XALATAN) 0.005 % ophthalmic solution  . lisinopril-hydrochlorothiazide (PRINZIDE,ZESTORETIC) 20-25 MG tablet  . Multiple Vitamins-Minerals (PRESERVISION AREDS 2 PO)  . omeprazole (PRILOSEC) 40 MG capsule  . zolpidem (AMBIEN) 10 MG tablet   No current facility-administered medications for this encounter.     Myra Gianotti, PA-C Surgical Short Stay/Anesthesiology [facility] Phone [phone] [facility] Phone [phone] 06/04/2019 11:17 AM

## 2019-06-05 ENCOUNTER — Encounter (HOSPITAL_COMMUNITY): Payer: Self-pay

## 2019-06-05 ENCOUNTER — Ambulatory Visit (HOSPITAL_COMMUNITY): Payer: Medicare Other | Admitting: Certified Registered Nurse Anesthetist

## 2019-06-05 ENCOUNTER — Encounter (HOSPITAL_COMMUNITY)
Admission: RE | Disposition: A | Discharge status: Home / Self Care | Payer: Self-pay | Source: Home / Self Care | Attending: Otolaryngology

## 2019-06-05 ENCOUNTER — Ambulatory Visit (HOSPITAL_COMMUNITY)
Admission: RE | Admit: 2019-06-05 | Discharge: 2019-06-06 | Disposition: A | Discharge status: Home / Self Care | Payer: Medicare Other | Attending: Otolaryngology | Admitting: Otolaryngology

## 2019-06-05 ENCOUNTER — Other Ambulatory Visit: Payer: Self-pay

## 2019-06-05 ENCOUNTER — Ambulatory Visit (HOSPITAL_COMMUNITY): Payer: Medicare Other | Admitting: Vascular Surgery

## 2019-06-05 ENCOUNTER — Ambulatory Visit (HOSPITAL_COMMUNITY): Payer: Medicare Other

## 2019-06-05 DIAGNOSIS — F419 Anxiety disorder, unspecified: Secondary | ICD-10-CM | POA: Insufficient documentation

## 2019-06-05 DIAGNOSIS — J449 Chronic obstructive pulmonary disease, unspecified: Secondary | ICD-10-CM | POA: Diagnosis not present

## 2019-06-05 DIAGNOSIS — K219 Gastro-esophageal reflux disease without esophagitis: Secondary | ICD-10-CM | POA: Diagnosis not present

## 2019-06-05 DIAGNOSIS — G4733 Obstructive sleep apnea (adult) (pediatric): Secondary | ICD-10-CM | POA: Insufficient documentation

## 2019-06-05 DIAGNOSIS — Z09 Encounter for follow-up examination after completed treatment for conditions other than malignant neoplasm: Secondary | ICD-10-CM

## 2019-06-05 DIAGNOSIS — Z87891 Personal history of nicotine dependence: Secondary | ICD-10-CM | POA: Insufficient documentation

## 2019-06-05 DIAGNOSIS — Z8546 Personal history of malignant neoplasm of prostate: Secondary | ICD-10-CM | POA: Diagnosis not present

## 2019-06-05 DIAGNOSIS — I10 Essential (primary) hypertension: Secondary | ICD-10-CM | POA: Insufficient documentation

## 2019-06-05 HISTORY — PX: IMPLANTATION OF HYPOGLOSSAL NERVE STIMULATOR: SHX6827

## 2019-06-05 SURGERY — INSERTION, HYPOGLOSSAL NERVE STIMULATOR
Anesthesia: General | Site: Chest | Laterality: Right

## 2019-06-05 MED ORDER — PHENYLEPHRINE HCL (PRESSORS) 10 MG/ML IV SOLN
INTRAVENOUS | Status: DC | PRN
  Administered 2019-06-05: 120 ug via INTRAVENOUS
  Administered 2019-06-05 (×2): 80 ug via INTRAVENOUS

## 2019-06-05 MED ORDER — OXYCODONE HCL 5 MG PO TABS: 5.0000 mg | ORAL_TABLET | Freq: Once | ORAL | Status: DC | PRN

## 2019-06-05 MED ORDER — SUCCINYLCHOLINE CHLORIDE 200 MG/10ML IV SOSY
PREFILLED_SYRINGE | INTRAVENOUS | Status: DC | PRN
  Administered 2019-06-05: 120 mg via INTRAVENOUS

## 2019-06-05 MED ORDER — LISINOPRIL 20 MG PO TABS
20.0000 mg | ORAL_TABLET | Freq: Every day | ORAL | Status: DC
  Administered 2019-06-05: 20 mg via ORAL
  Filled 2019-06-05: qty 1

## 2019-06-05 MED ORDER — ONDANSETRON HCL 4 MG PO TABS: 4.0000 mg | ORAL_TABLET | ORAL | Status: DC | PRN

## 2019-06-05 MED ORDER — PROSIGHT PO TABS
ORAL_TABLET | Freq: Two times a day (BID) | ORAL | Status: DC
  Filled 2019-06-05: qty 1

## 2019-06-05 MED ORDER — CHLORHEXIDINE GLUCONATE CLOTH 2 % EX PADS: 6.0000 | MEDICATED_PAD | Freq: Once | CUTANEOUS | Status: DC

## 2019-06-05 MED ORDER — PROPOFOL 500 MG/50ML IV EMUL
INTRAVENOUS | Status: DC | PRN
  Administered 2019-06-05: 50 ug/kg/min via INTRAVENOUS

## 2019-06-05 MED ORDER — DEXAMETHASONE SODIUM PHOSPHATE 10 MG/ML IJ SOLN
INTRAMUSCULAR | Status: AC
  Filled 2019-06-05: qty 1

## 2019-06-05 MED ORDER — MORPHINE SULFATE (PF) 2 MG/ML IV SOLN: 2.0000 mg | INTRAVENOUS | Status: DC | PRN

## 2019-06-05 MED ORDER — HYDROCODONE-ACETAMINOPHEN 5-325 MG PO TABS: 1.0000 | ORAL_TABLET | Freq: Two times a day (BID) | ORAL | 0 refills | Status: AC | PRN

## 2019-06-05 MED ORDER — VITAMIN B-12 1000 MCG PO TABS
1000.0000 ug | ORAL_TABLET | Freq: Every day | ORAL | Status: DC
  Administered 2019-06-05: 1000 ug via ORAL
  Filled 2019-06-05: qty 1

## 2019-06-05 MED ORDER — ONDANSETRON HCL 4 MG/2ML IJ SOLN
INTRAMUSCULAR | Status: DC | PRN
  Administered 2019-06-05: 4 mg via INTRAVENOUS

## 2019-06-05 MED ORDER — HYDROCHLOROTHIAZIDE 25 MG PO TABS
25.0000 mg | ORAL_TABLET | Freq: Every day | ORAL | Status: DC
  Administered 2019-06-05: 25 mg via ORAL
  Filled 2019-06-05: qty 1

## 2019-06-05 MED ORDER — VITAMIN D (ERGOCALCIFEROL) 1.25 MG (50000 UNIT) PO CAPS: 50000.0000 [IU] | ORAL_CAPSULE | ORAL | Status: DC

## 2019-06-05 MED ORDER — 0.9 % SODIUM CHLORIDE (POUR BTL) OPTIME
TOPICAL | Status: DC | PRN
  Administered 2019-06-05: 1000 mL

## 2019-06-05 MED ORDER — PROPOFOL 10 MG/ML IV BOLUS
INTRAVENOUS | Status: DC | PRN
  Administered 2019-06-05: 20 mg via INTRAVENOUS
  Administered 2019-06-05: 180 mg via INTRAVENOUS
  Administered 2019-06-05: 20 mg via INTRAVENOUS

## 2019-06-05 MED ORDER — LISINOPRIL-HYDROCHLOROTHIAZIDE 20-25 MG PO TABS: 1.0000 | ORAL_TABLET | Freq: Every day | ORAL | Status: DC

## 2019-06-05 MED ORDER — LATANOPROST 0.005 % OP SOLN
1.0000 [drp] | Freq: Every day | OPHTHALMIC | Status: DC
  Filled 2019-06-05: qty 2.5

## 2019-06-05 MED ORDER — CEFAZOLIN SODIUM-DEXTROSE 2-4 GM/100ML-% IV SOLN
INTRAVENOUS | Status: AC
  Filled 2019-06-05: qty 100

## 2019-06-05 MED ORDER — OXYCODONE HCL 5 MG/5ML PO SOLN: 5.0000 mg | Freq: Once | ORAL | Status: DC | PRN

## 2019-06-05 MED ORDER — ACETAMINOPHEN 500 MG PO TABS
ORAL_TABLET | ORAL | Status: AC
  Administered 2019-06-05: 1000 mg via ORAL
  Filled 2019-06-05: qty 2

## 2019-06-05 MED ORDER — DEXAMETHASONE SODIUM PHOSPHATE 10 MG/ML IJ SOLN
10.0000 mg | Freq: Once | INTRAMUSCULAR | Status: AC
  Administered 2019-06-05: 5 mg via INTRAVENOUS

## 2019-06-05 MED ORDER — PROMETHAZINE HCL 25 MG/ML IJ SOLN: 6.2500 mg | INTRAMUSCULAR | Status: DC | PRN

## 2019-06-05 MED ORDER — FENTANYL CITRATE (PF) 100 MCG/2ML IJ SOLN: 25.0000 ug | INTRAMUSCULAR | Status: DC | PRN

## 2019-06-05 MED ORDER — SODIUM CHLORIDE 0.9 % IV SOLN
INTRAVENOUS | Status: DC | PRN
  Administered 2019-06-05: 13:00:00 via INTRAVENOUS
  Administered 2019-06-05: 11:00:00 25 ug/min via INTRAVENOUS

## 2019-06-05 MED ORDER — EPHEDRINE SULFATE 50 MG/ML IJ SOLN
INTRAMUSCULAR | Status: DC | PRN
  Administered 2019-06-05 (×6): 10 mg via INTRAVENOUS

## 2019-06-05 MED ORDER — ALPRAZOLAM 0.5 MG PO TABS
0.5000 mg | ORAL_TABLET | Freq: Two times a day (BID) | ORAL | Status: DC
  Administered 2019-06-05: 0.5 mg via ORAL
  Filled 2019-06-05: qty 1

## 2019-06-05 MED ORDER — CEFAZOLIN SODIUM-DEXTROSE 2-4 GM/100ML-% IV SOLN
2.0000 g | INTRAVENOUS | Status: AC
  Administered 2019-06-05: 2 g via INTRAVENOUS

## 2019-06-05 MED ORDER — LABETALOL HCL 100 MG PO TABS
600.0000 mg | ORAL_TABLET | Freq: Two times a day (BID) | ORAL | Status: DC
  Filled 2019-06-05: qty 6

## 2019-06-05 MED ORDER — PANTOPRAZOLE SODIUM 40 MG PO TBEC: 40.0000 mg | DELAYED_RELEASE_TABLET | Freq: Every day | ORAL | Status: DC

## 2019-06-05 MED ORDER — ACETAMINOPHEN 500 MG PO TABS
1000.0000 mg | ORAL_TABLET | Freq: Once | ORAL | Status: AC
  Administered 2019-06-05: 09:00:00 1000 mg via ORAL

## 2019-06-05 MED ORDER — GLYCOPYRROLATE 0.2 MG/ML IJ SOLN
INTRAMUSCULAR | Status: DC | PRN
  Administered 2019-06-05: 0.2 mg via INTRAVENOUS

## 2019-06-05 MED ORDER — AMLODIPINE BESYLATE 5 MG PO TABS: 5.0000 mg | ORAL_TABLET | Freq: Every day | ORAL | Status: DC

## 2019-06-05 MED ORDER — ONDANSETRON HCL 4 MG/2ML IJ SOLN: 4.0000 mg | INTRAMUSCULAR | Status: DC | PRN

## 2019-06-05 MED ORDER — SODIUM CHLORIDE 0.9 % IV SOLN
INTRAVENOUS | Status: AC
  Filled 2019-06-05: qty 500000

## 2019-06-05 MED ORDER — HYDROCODONE-ACETAMINOPHEN 5-325 MG PO TABS
1.0000 | ORAL_TABLET | ORAL | Status: DC | PRN
  Filled 2019-06-05: qty 2

## 2019-06-05 MED ORDER — FENTANYL CITRATE (PF) 250 MCG/5ML IJ SOLN
INTRAMUSCULAR | Status: AC
  Filled 2019-06-05: qty 5

## 2019-06-05 MED ORDER — ZOLPIDEM TARTRATE 5 MG PO TABS
5.0000 mg | ORAL_TABLET | Freq: Every day | ORAL | Status: DC
  Administered 2019-06-05: 5 mg via ORAL
  Filled 2019-06-05: qty 1

## 2019-06-05 MED ORDER — LIDOCAINE-EPINEPHRINE 1 %-1:100000 IJ SOLN
INTRAMUSCULAR | Status: DC | PRN
  Administered 2019-06-05: 7 mL

## 2019-06-05 MED ORDER — DEXTROSE IN LACTATED RINGERS 5 % IV SOLN
INTRAVENOUS | Status: DC
  Administered 2019-06-05: 18:00:00 via INTRAVENOUS

## 2019-06-05 MED ORDER — SUCCINYLCHOLINE CHLORIDE 200 MG/10ML IV SOSY
PREFILLED_SYRINGE | INTRAVENOUS | Status: AC
  Filled 2019-06-05: qty 10

## 2019-06-05 MED ORDER — LACTATED RINGERS IV SOLN
INTRAVENOUS | Status: DC
  Administered 2019-06-05 (×2): via INTRAVENOUS

## 2019-06-05 MED ORDER — LIDOCAINE-EPINEPHRINE 1 %-1:100000 IJ SOLN
INTRAMUSCULAR | Status: AC
  Filled 2019-06-05: qty 1

## 2019-06-05 MED ORDER — PHENYLEPHRINE 40 MCG/ML (10ML) SYRINGE FOR IV PUSH (FOR BLOOD PRESSURE SUPPORT)
PREFILLED_SYRINGE | INTRAVENOUS | Status: AC
  Filled 2019-06-05: qty 20

## 2019-06-05 MED ORDER — EPHEDRINE 5 MG/ML INJ
INTRAVENOUS | Status: AC
  Filled 2019-06-05: qty 10

## 2019-06-05 MED ORDER — LIDOCAINE 2% (20 MG/ML) 5 ML SYRINGE
INTRAMUSCULAR | Status: AC
  Filled 2019-06-05: qty 5

## 2019-06-05 MED ORDER — SODIUM CHLORIDE 0.9 % IV SOLN
INTRAVENOUS | Status: DC | PRN
  Administered 2019-06-05: 500 mL

## 2019-06-05 MED ORDER — GLYCOPYRROLATE PF 0.2 MG/ML IJ SOSY
PREFILLED_SYRINGE | INTRAMUSCULAR | Status: AC
  Filled 2019-06-05: qty 1

## 2019-06-05 MED ORDER — ONDANSETRON HCL 4 MG/2ML IJ SOLN
INTRAMUSCULAR | Status: AC
  Filled 2019-06-05: qty 4

## 2019-06-05 MED ORDER — LIDOCAINE 2% (20 MG/ML) 5 ML SYRINGE
INTRAMUSCULAR | Status: DC | PRN
  Administered 2019-06-05: 100 mg via INTRAVENOUS

## 2019-06-05 MED ORDER — FOLIC ACID 1 MG PO TABS
1.0000 mg | ORAL_TABLET | Freq: Every day | ORAL | Status: DC
  Administered 2019-06-05: 1 mg via ORAL
  Filled 2019-06-05: qty 1

## 2019-06-05 MED ORDER — FENTANYL CITRATE (PF) 250 MCG/5ML IJ SOLN
INTRAMUSCULAR | Status: DC | PRN
  Administered 2019-06-05: 100 ug via INTRAVENOUS

## 2019-06-05 MED ORDER — IBUPROFEN 100 MG/5ML PO SUSP: 400.0000 mg | Freq: Four times a day (QID) | ORAL | Status: DC | PRN

## 2019-06-05 MED ORDER — STERILE WATER FOR IRRIGATION IR SOLN
Status: DC | PRN
  Administered 2019-06-05: 1000 mL

## 2019-06-05 SURGICAL SUPPLY — 63 items
BAG DECANTER FOR FLEXI CONT (MISCELLANEOUS) ×3 IMPLANT
BLADE CLIPPER SURG (BLADE) ×3 IMPLANT
BLADE SURG 15 STRL LF DISP TIS (BLADE) ×3 IMPLANT
BLADE SURG 15 STRL SS (BLADE) ×6
CANISTER SUCT 3000ML PPV (MISCELLANEOUS) ×3 IMPLANT
CORD BIPOLAR FORCEPS 12FT (ELECTRODE) ×3 IMPLANT
COVER PROBE W GEL 5X96 (DRAPES) ×3 IMPLANT
COVER SURGICAL LIGHT HANDLE (MISCELLANEOUS) ×3 IMPLANT
DERMABOND ADVANCED (GAUZE/BANDAGES/DRESSINGS) ×6
DERMABOND ADVANCED .7 DNX12 (GAUZE/BANDAGES/DRESSINGS) ×3 IMPLANT
DRAPE C-ARM 35X43 STRL (DRAPES) ×3 IMPLANT
DRAPE HEAD BAR (DRAPES) ×3 IMPLANT
DRAPE INCISE IOBAN 66X45 STRL (DRAPES) ×3 IMPLANT
DRAPE MICROSCOPE LEICA 54X105 (DRAPE) ×3 IMPLANT
DRAPE UTILITY XL STRL (DRAPES) ×3 IMPLANT
DRSG TEGADERM 2-3/8X2-3/4 SM (GAUZE/BANDAGES/DRESSINGS) ×6 IMPLANT
ELECT COATED BLADE 2.86 ST (ELECTRODE) ×3 IMPLANT
ELECT EMG 18 NIMS (NEUROSURGERY SUPPLIES) ×3
ELECTRODE EMG 18 NIMS (NEUROSURGERY SUPPLIES) ×1 IMPLANT
FORCEPS BIPOLAR SPETZLER 8 1.0 (NEUROSURGERY SUPPLIES) ×3 IMPLANT
GAUZE 4X4 16PLY RFD (DISPOSABLE) ×3 IMPLANT
GAUZE SPONGE 4X4 12PLY STRL (GAUZE/BANDAGES/DRESSINGS) ×9 IMPLANT
GENERATOR PULSE INSPIRE (Generator) ×3 IMPLANT
GLOVE BIO SURGEON STRL SZ 6.5 (GLOVE) ×2 IMPLANT
GLOVE BIO SURGEONS STRL SZ 6.5 (GLOVE) ×1
GLOVE BIOGEL M 7.0 STRL (GLOVE) ×6 IMPLANT
GOWN STRL REUS W/ TWL LRG LVL3 (GOWN DISPOSABLE) ×1 IMPLANT
GOWN STRL REUS W/TWL LRG LVL3 (GOWN DISPOSABLE) ×2
IV CATH 18GX1.25 SAFE RETR GRN (IV SOLUTION) ×3 IMPLANT
KIT BASIN OR (CUSTOM PROCEDURE TRAY) ×3 IMPLANT
KIT TURNOVER KIT B (KITS) ×3 IMPLANT
LEAD SENSING RESP INSPIRE (Lead) ×3 IMPLANT
LEAD SLEEP STIMULATION INSPIRE (Lead) ×3 IMPLANT
LOOP VESSEL MAXI BLUE (MISCELLANEOUS) ×3 IMPLANT
LOOP VESSEL MINI RED (MISCELLANEOUS) ×3 IMPLANT
MARKER SKIN DUAL TIP RULER LAB (MISCELLANEOUS) ×6 IMPLANT
NEEDLE HYPO 25GX1X1/2 BEV (NEEDLE) ×3 IMPLANT
NS IRRIG 1000ML POUR BTL (IV SOLUTION) ×3 IMPLANT
PAD ARMBOARD 7.5X6 YLW CONV (MISCELLANEOUS) ×3 IMPLANT
PASSER CATH 38CM DISP (INSTRUMENTS) ×3 IMPLANT
PENCIL BUTTON HOLSTER BLD 10FT (ELECTRODE) ×3 IMPLANT
POSITIONER HEAD DONUT 9IN (MISCELLANEOUS) ×3 IMPLANT
PROBE NERVE STIMULATOR (NEUROSURGERY SUPPLIES) ×3 IMPLANT
REMOTE CONTROL SLEEP INSPIRE (MISCELLANEOUS) ×3 IMPLANT
SET WALTER ACTIVATION W/DRAPE (SET/KITS/TRAYS/PACK) ×3 IMPLANT
SLING ARM FOAM STRAP LRG (SOFTGOODS) ×3 IMPLANT
SPONGE INTESTINAL PEANUT (DISPOSABLE) ×3 IMPLANT
STAPLER VISISTAT 35W (STAPLE) ×3 IMPLANT
SUT SILK 2 0 SH (SUTURE) ×3 IMPLANT
SUT SILK 3 0 RB1 (SUTURE) ×3 IMPLANT
SUT SILK 3 0 REEL (SUTURE) ×3 IMPLANT
SUT SILK 3 0 SH 30 (SUTURE) ×6 IMPLANT
SUT SILK 3-0 (SUTURE) ×4
SUT SILK 3-0 RB1 30XBRD (SUTURE) ×2
SUT VIC AB 4-0 PS2 27 (SUTURE) ×6 IMPLANT
SUT VIC AB 5-0 P-3 18X BRD (SUTURE) ×1 IMPLANT
SUT VIC AB 5-0 P-3 18XBRD (SUTURE) ×2 IMPLANT
SUT VIC AB 5-0 P3 18 (SUTURE) ×6
SUTURE SILK 3-0 RB1 30XBRD (SUTURE) ×2 IMPLANT
SYR 10ML LL (SYRINGE) ×3 IMPLANT
TAPE CLOTH SURG 4X10 WHT LF (GAUZE/BANDAGES/DRESSINGS) ×9 IMPLANT
TOWEL GREEN STERILE (TOWEL DISPOSABLE) ×3 IMPLANT
TRAY ENT MC OR (CUSTOM PROCEDURE TRAY) ×3 IMPLANT

## 2019-06-05 NOTE — Progress Notes (Signed)
ENT Post Operative Note  Subjective: No nausea, vomiting No difficulty voiding Pain well controlled  Vitals:   06/05/19 1605 06/05/19 1632  BP: (!) 168/71 (!) 166/76  Pulse: 66 67  Resp: 12 11  Temp:  97.6 F (36.4 C)  SpO2: 95% 94%     OBJECTIVE  Gen: alert, cooperative, appropriate Head/ENT: EOMI, neck supple, mucus membranes moist and pink, conjunctiva clear Incisions c/d/i Face moves symmetrically Tongue moves symmetrically Respiratory: Voice without dysphonia. non-labored breathing, no accessory muscle use, normal HR, good O2 saturations Incisions c/d/i  ASSESS/ PLAN  Ricky Zavala is a 78 y.o. male who is POD 0 from hypoglossal nerve stimulator implantation.  -pain control -MIVF- will saline lock when tolerating PO intake -wound care, routine, right arm in sling   Helayne [place]in[place], MD

## 2019-06-05 NOTE — H&P (Signed)
Ricky Zavala is an 78 y.o. male.   Chief Complaint: OSA HPI: Pt with severe OSA and difficulty tolerating CPAP  Past Medical History:  Diagnosis Date  . Anxiety   . Barrett's esophagus   . Cancer [facility])    prostate cancer-had surgery  . Chronic back pain   . COPD (chronic obstructive pulmonary disease) ([place])   . ETOH abuse    daily 2-3-vodka drinks  . GERD (gastroesophageal reflux disease)   . Hypercholesteremia   . Hypertension   . Sleep apnea    severe OSA uses CPAP nightly AHI 37.6    Past Surgical History:  Procedure Laterality Date  . APPENDECTOMY    . CARDIAC CATHETERIZATION     care everywhere- August 2019  . COLONOSCOPY    . DRUG INDUCED ENDOSCOPY N/A 03/15/2019   Procedure: DRUG INDUCED ENDOSCOPY;  Surgeon: Jerrell Belfast, MD;  Location: [facility];  Service: ENT;  Laterality: N/A;  Drug induced sleep endoscopy  . EYE SURGERY Bilateral   . GANGLION CYST EXCISION Left    wrist  . LASIK Right   . PROSTATECTOMY    . TONSILLECTOMY      History reviewed. No pertinent family history. Social History:  reports that he quit smoking about 25 years ago. He smoked 3.00 packs per day. He has never used smokeless tobacco. He reports current alcohol use. He reports previous drug use.  Allergies: No Known Allergies  Medications Prior to Admission  Medication Sig Dispense Refill  . ALPRAZolam (XANAX) 0.5 MG tablet Take 0.5 mg by mouth 2 (two) times daily.     Marland Kitchen amLODipine (NORVASC) 5 MG tablet Take 5 mg by mouth daily.     . cyanocobalamin 1000 MCG tablet Take 1,000 mcg by mouth daily.     . ergocalciferol (VITAMIN D2) 50000 units capsule Take 50,000 Units by mouth every Tuesday.     . folic acid (FOLVITE) 1 MG tablet Take 1 mg by mouth daily.     Marland Kitchen labetalol (NORMODYNE) 200 MG tablet Take 600 mg by mouth 2 (two) times daily.     Marland Kitchen latanoprost (XALATAN) 0.005 % ophthalmic solution Place 1 drop into both eyes at bedtime.     Marland Kitchen lisinopril-hydrochlorothiazide  (PRINZIDE,ZESTORETIC) 20-25 MG tablet Take 1 tablet by mouth daily.     . Multiple Vitamins-Minerals (PRESERVISION AREDS 2 PO) Take 1 tablet by mouth 2 (two) times daily.     Marland Kitchen omeprazole (PRILOSEC) 40 MG capsule Take 40 mg by mouth 2 (two) times daily.     Marland Kitchen zolpidem (AMBIEN) 10 MG tablet Take 10 mg by mouth at bedtime.       Results for orders placed or performed during the hospital encounter of 06/03/19 (from the past 48 hour(s))  SARS CORONAVIRUS 2 (TAT 6-24 HRS) Nasopharyngeal Nasopharyngeal Swab     Status: None   Collection Time: 06/03/19  2:27 PM   Specimen: Nasopharyngeal Swab  Result Value Ref Range   SARS Coronavirus 2 NEGATIVE NEGATIVE    Comment: (NOTE) SARS-CoV-2 target nucleic acids are NOT DETECTED. The SARS-CoV-2 RNA is generally detectable in upper and lower respiratory specimens during the acute phase of infection. Negative results do not preclude SARS-CoV-2 infection, do not rule out co-infections with other pathogens, and should not be used as the sole basis for treatment or other patient management decisions. Negative results must be combined with clinical observations, patient history, and epidemiological information. The expected result is Negative. Fact Sheet for Patients: SugarRoll[employer]be Fact  Sheet for Healthcare Providers: https://www.woods-mathews.com/ This test is not yet approved or cleared by the [place] FDA and  has been authorized for detection and/or diagnosis of SARS-CoV-2 by FDA under an Emergency Use Authorization (EUA). This EUA will remain  in effect (meaning this test can be used) for the duration of the COVID-19 declaration under Section 56 4(b)(1) of the Act, 21 U.S.C. section 360bbb-3(b)(1), unless the authorization is terminated or revoked sooner. Performed at [facility], [place] [address], [place], [place] [address]    No results found.  Review of Systems  Constitutional: Negative.    Respiratory: Negative.   Cardiovascular: Negative.     Blood pressure (!) 162/79, pulse 64, temperature 97.9 F (36.6 C), temperature source Oral, resp. rate 18, height 5\' 11"  (1.803 m), weight 97.1 kg, SpO2 98 %. Physical Exam  Constitutional: He appears well-developed and well-nourished.  Neck: Normal range of motion. Neck supple.  Cardiovascular: Normal rate.  Respiratory: Effort normal.     Assessment/Plan Adm for Inspire Implant under GA as OP with O/N obs  Jerrell Belfast, MD 06/05/2019, 10:05 AM

## 2019-06-05 NOTE — Op Note (Signed)
Operative Note: INSPIRE IMPLANT  Patient: Ricky Zavala  Medical record number: [id number]  Date:06/05/2019  Pre-operative Indications: Moderate/severe obstructive sleep apnea with positive airway pressure intolerance  Postoperative Indications: Same  Surgical Procedure:  1.  12th cranial nerve (hypoglossal) stimulation implant    2.  Placement of chest wall respiratory sensor    3.  Electronic analysis of implanted neurostimulator with pulse generator system  Anesthesia: GET  Surgeon: Delsa Bern, M.D.  Assist: Jolene Provost, PA   Complications: None  EBL: 50 cc   Brief History: The patient is a 78 y.o. male with a history of moderate/severe obstructive sleep apnea.  The patient has undergone work-up including sleep study and trial of CPAP which they did not tolerate.  Patient is unable to use long-term CPAP for control of obstructive sleep apnea.  Patient underwent DISE which showed anterior to posterior airway obstruction, considered a good candidate for Inspire Implant. Given the patient's history and findings, I recommended Inspire Implant under general anesthesia, risks and benefits were discussed in detail with the patient and family. They understand and agree with our plan for surgery which is scheduled at [facility] on an elective basis.  Surgical Procedure: The patient is brought to the operating room on 06/05/2019 and placed in supine position on the operating table. General endotracheal anesthesia was established without difficulty. When the patient was adequately anesthetized, surgical timeout was performed and correct identification of the patient and the surgical procedure. The patient was injected with 7 cc of 1% lidocaine 1:100,000 dilution epinephrine in subcutaneous fashion in the proposed skin incisions.  The patient was positioned and prepped and draped in sterile fashion.  The NIMS monitoring system was positioned and electrodes were placed in the anterior  floor of mouth and tongue to assess the genioglossus and styloglossus muscle groups.  Nerve monitoring was used throughout the surgical procedure.  The procedure was begun by creating a modified right submandibular incision in the upper anterior lateral neck ~2 cm below the mandible and in a natural skin crease.  The incision was carried through the skin and underlying subcutaneous tissue to the level of the platysma.  Platysma muscle was divided and subplatysmal flaps were elevated superiorly and inferiorly.  The submandibular space was entered and the submandibular gland was identified and retracted posteriorly.  The digastric tendon was identified and dissection was carried out anterior and posterior along the tendon and muscle bellies.  The mylohyoid muscle was identified and retracted anteriorly and the hypoglossal nerve was carefully dissected across the anterior floor of the submandibular space.  Lateral branches to the retrusor muscles were identified and tested intraoperatively using the NIMS stimulator.  Stimulation electrode cuff for the hypoglossal nerve stimulator was placed distal to these branches on the medial hypoglossal nerve branch innervating the genioglossus muscle.  Diagnostic evaluation confirmed activation of the genioglossus muscle, resulting in genioglossal activation and tongue protrusion which was visually confirmed in the operating room.  The stimulation electrode was  sutured to the digastric tendon with interrupted 4-0 silk sutures.  A second second incision was created in the anterior upper chest wall 5 cm below the clavicle.  Incision was carried through the skin and underlying subcutaneous tissue to the level of the pectoralis major muscle.  The IPG pocket was created deep to the subcutaneous layer and superficial to the muscle.  Stimulation lead was then tunneled in a subplatysmal plane and brought out into the subclavicular pocket.  Third incision was made in the  axillary  line measuring approximately 5 cm.  Dissection was carried down through the skin and underlying subcutaneous tissue.  The serratus anterior muscle was identified and separated.  The external oblique muscle was identified and incised and a tunnel was created between the external and internal intercostal muscles in the approximately fifth intercostal space on the superior edge of the sixth rib.  The pleural sensor was then placed in the intercostal pocket.  Leads were sutured with 3-0 silk suture to the provided anchors which were used to fix the sensor in its proper orientation facing the pleural space.  The sensing lead was then tunneled in a subcutaneous plane to the subclavicular pocket.  The stimulating electrode and respiration sensing lead were connected to the implantable pulse generator.  Diagnostic evaluation was run confirming respiratory signal and good tongue protrusion on stimulation.  The implantable pulse generator was then placed in the subclavicular pocket and sutured to the pectoralis fascia with 4-0 silk sutures sutures.  The incisions were thoroughly irrigated with bacitracin saline irrigation.  The incisions were then closed in multiple layers with 4-0 and 5-0 Vicryl interrupted sutures in the deep and superficial subcutaneous levels at each incision site.  Dermabond surgical glue was used for skin closure.  The patient's incisions were dressed with rolled gauze and Hypafix tape for pressure dressing.  The patient's right arm was placed in a sling.  An orogastric tube was passed and stomach contents were aspirated. Patient was awakened from anesthetic and transferred from the operating room to the recovery room in stable condition. There were no complications and blood loss was minimal.  X-rays were obtained in the recovery room to assess the proper location of the pulse generator, sensor lead and stimulation lead.   Delsa Bern, M.D. [facility] 06/05/2019

## 2019-06-05 NOTE — Anesthesia Postprocedure Evaluation (Signed)
Anesthesia Post Note  Patient: Ricky Zavala  Procedure(s) Performed: IMPLANTATION OF HYPOGLOSSAL NERVE STIMULATOR (Right Chest)     Patient location during evaluation: PACU Anesthesia Type: General Level of consciousness: awake and alert Pain management: pain level controlled Vital Signs Assessment: post-procedure vital signs reviewed and stable Respiratory status: spontaneous breathing, nonlabored ventilation, respiratory function stable and patient connected to nasal cannula oxygen Cardiovascular status: blood pressure returned to baseline and stable Postop Assessment: no apparent nausea or vomiting Anesthetic complications: no    Last Vitals:  Vitals:   06/05/19 1405 06/05/19 1420  BP: 127/77 (!) 150/82  Pulse: 68 78  Resp: 13 15  Temp: (!) 36.1 C   SpO2: 95% 95%    Last Pain:  Vitals:   06/05/19 1405  TempSrc:   PainSc: Asleep                 Leopold Smyers

## 2019-06-05 NOTE — Transfer of Care (Signed)
Immediate Anesthesia Transfer of Care Note  Patient: Ricky Zavala  Procedure(s) Performed: IMPLANTATION OF HYPOGLOSSAL NERVE STIMULATOR (Right Chest)  Patient Location: PACU  Anesthesia Type:General  Level of Consciousness: awake, alert , patient cooperative and responds to stimulation  Airway & Oxygen Therapy: Patient Spontanous Breathing and Patient connected to face mask oxygen  Post-op Assessment: Report given to RN, Post -op Vital signs reviewed and stable and Patient moving all extremities X 4  Post vital signs: Reviewed and stable  Last Vitals:  Vitals Value Taken Time  BP 127/77 06/05/19 1405  Temp    Pulse 68 06/05/19 1405  Resp 18 06/05/19 1405  SpO2 95 % 06/05/19 1405  Vitals shown include unvalidated device data.  Last Pain:  Vitals:   06/05/19 0922  TempSrc:   PainSc: 0-No pain         Complications: No apparent anesthesia complications

## 2019-06-05 NOTE — Anesthesia Procedure Notes (Signed)
Procedure Name: Intubation Date/Time: 06/05/2019 10:51 AM Performed by: Glynda Jaeger, CRNA Pre-anesthesia Checklist: Patient identified, Patient being monitored, Timeout performed, Emergency Drugs available and Suction available Patient Re-evaluated:Patient Re-evaluated prior to induction Oxygen Delivery Method: Circle System Utilized Preoxygenation: Pre-oxygenation with 100% oxygen Induction Type: IV induction Ventilation: Mask ventilation without difficulty Laryngoscope Size: Glidescope and 4 Grade View: Grade I Tube type: Oral Tube size: 7.5 mm Number of attempts: 1 Airway Equipment and Method: Video-laryngoscopy Placement Confirmation: ETT inserted through vocal cords under direct vision,  positive ETCO2 and breath sounds checked- equal and bilateral Secured at: 21 cm Tube secured with: Tape Dental Injury: Teeth and Oropharynx as per pre-operative assessment  Difficulty Due To: Difficult Airway- due to anterior larynx Comments: DL x 1 with Mac 4 by Breckin Savannah CRNA. Grade 3 view. DL x 1 with Sabra Heck 2 by [facility] MD. Grade 3 view. VL with glidescope 4. Grade 1 view.

## 2019-06-05 NOTE — Progress Notes (Signed)
Patient admitted to unit from PACU. Patient has 3 dressings in place to neck, right chest and right lower chest/rib area, clean, dry and intact. Patient settled in bed and oriented to unit and hospital routine. Pt resting comfortably in bed with call bell in reach and side rails up. Instructed patient to utilize call bell for assistance. Will continue to monitor closely for remainder of shift.

## 2019-06-06 ENCOUNTER — Encounter (HOSPITAL_COMMUNITY): Payer: Self-pay | Admitting: General Practice

## 2019-06-06 ENCOUNTER — Other Ambulatory Visit: Payer: Self-pay

## 2019-06-06 DIAGNOSIS — G4733 Obstructive sleep apnea (adult) (pediatric): Secondary | ICD-10-CM | POA: Diagnosis not present

## 2019-06-06 NOTE — Progress Notes (Signed)
Patient discharged to home with instructions. 

## 2019-06-06 NOTE — Progress Notes (Signed)
   ENT Progress Note: POD #1 s/p Procedure(s): IMPLANTATION OF HYPOGLOSSAL NERVE STIMULATOR   Subjective: Mild pain  Objective: Vital signs in last 24 hours: Temp:  [97 F (36.1 C)-98.4 F (36.9 C)] 98.1 F (36.7 C) (09/17 0552) Pulse Rate:  [61-78] 65 (09/17 0552) Resp:  [11-18] 18 (09/17 0552) BP: (127-174)/(71-90) 162/84 (09/17 0552) SpO2:  [94 %-99 %] 97 % (09/17 0552) Weight:  [97.1 kg] 97.1 kg (09/16 0856) Weight change:  Last BM Date: 06/04/19  Intake/Output from previous day: 09/16 0701 - 09/17 0700 In: 2843.8 [P.O.:120; I.V.:2723.8] Out: 10 [Blood:10] Intake/Output this shift: No intake/output data recorded.  Labs: Recent Labs    06/03/19 1400  WBC 7.6  HGB 13.3  HCT 40.3  PLT 205   Recent Labs    06/03/19 1400  NA 138  K 3.7  CL 100  CO2 28  GLUCOSE 109*  BUN 20  CALCIUM 9.0    Studies/Results: Dg Neck Soft Tissue  Result Date: 06/05/2019 CLINICAL DATA:  Status post stimulator placement EXAM: NECK SOFT TISSUES - 1+ VIEW COMPARISON:  None. FINDINGS: Stimulator wire extends from the right upper chest into the submental position on the right. Carotid calcifications are seen. No other focal abnormality is noted. IMPRESSION: Status post stimulator implantation.  No acute abnormality noted. Electronically Signed   By: Inez Catalina M.D.   On: 06/05/2019 19:52   Dg Chest Port 1 View  Result Date: 06/05/2019 CLINICAL DATA:  Status post stimulator placement EXAM: PORTABLE CHEST 1 VIEW COMPARISON:  None. FINDINGS: Cardiac shadows within normal limits. The lungs are well aerated bilaterally. Stimulator pack is noted over the right chest with a lead over the lower lung field and a second extending into the neck. No pneumothorax is noted. IMPRESSION: No complicating factors following stimulator placement Electronically Signed   By: Inez Catalina M.D.   On: 06/05/2019 19:52     PHYSICAL EXAM: Inc intact - min edema and ecchymosis Normal tongue  mobility   Assessment/Plan: D/c to home    Jerrell Belfast 06/06/2019, 8:20 AM

## 2019-06-06 NOTE — Discharge Summary (Signed)
Physician Discharge Summary  Patient ID: Ricky Zavala MRN: [id number] DOB/AGE: August 08, 1941 78 y.o.  Admit date: 06/05/2019 Discharge date: 06/06/2019  Admission Diagnoses:  Active Problems:   Obstructive sleep apnea   Discharge Diagnoses:  Same  Surgeries: Procedure(s): IMPLANTATION OF HYPOGLOSSAL NERVE STIMULATOR on 06/05/2019   Consultants: None  Discharged Condition: Improved  Hospital Course: Ricky Zavala is an 78 y.o. male who was admitted 06/05/2019 with a diagnosis of Active Problems:   Obstructive sleep apnea  and went to the operating room on 06/05/2019 and underwent the above named procedures.   Stable POD #1, d/c to home.  Recent vital signs:  Vitals:   06/06/19 0156 06/06/19 0552  BP: (!) 156/82 (!) 162/84  Pulse: 72 65  Resp: 16 18  Temp: 97.7 F (36.5 C) 98.1 F (36.7 C)  SpO2: 99% 97%    Recent laboratory studies:  Results for orders placed or performed during the hospital encounter of [id number]  Basic metabolic panel  Result Value Ref Range   Sodium 135 135 - 145 mmol/L   Potassium 4.0 3.5 - 5.1 mmol/L   Chloride 101 98 - 111 mmol/L   CO2 25 22 - 32 mmol/L   Glucose, Bld 106 (H) 70 - 99 mg/dL   BUN 15 8 - 23 mg/dL   Creatinine, Ser 0.83 0.61 - 1.24 mg/dL   Calcium 9.0 8.9 - 10.3 mg/dL   GFR calc non Af Amer >60 >60 mL/min   GFR calc Af Amer >60 >60 mL/min   Anion gap 9 5 - 15    Discharge Medications:   Allergies as of 06/06/2019   No Known Allergies     Medication List    TAKE these medications   ALPRAZolam 0.5 MG tablet Commonly known as: XANAX Take 0.5 mg by mouth 2 (two) times daily.   amLODipine 5 MG tablet Commonly known as: NORVASC Take 5 mg by mouth daily.   cyanocobalamin 1000 MCG tablet Take 1,000 mcg by mouth daily.   ergocalciferol 1.25 MG (50000 UT) capsule Commonly known as: VITAMIN D2 Take 50,000 Units by mouth every Tuesday.   folic acid 1 MG tablet Commonly known as: FOLVITE Take 1 mg by mouth daily.    HYDROcodone-acetaminophen 5-325 MG tablet Commonly known as: Norco Take 1-2 tablets by mouth every 12 (twelve) hours as needed for up to 7 days for moderate pain.   labetalol 200 MG tablet Commonly known as: NORMODYNE Take 600 mg by mouth 2 (two) times daily.   latanoprost 0.005 % ophthalmic solution Commonly known as: XALATAN Place 1 drop into both eyes at bedtime.   lisinopril-hydrochlorothiazide 20-25 MG tablet Commonly known as: ZESTORETIC Take 1 tablet by mouth daily.   omeprazole 40 MG capsule Commonly known as: PRILOSEC Take 40 mg by mouth 2 (two) times daily.   PRESERVISION AREDS 2 PO Take 1 tablet by mouth 2 (two) times daily.   zolpidem 10 MG tablet Commonly known as: AMBIEN Take 10 mg by mouth at bedtime.       Diagnostic Studies: Dg Neck Soft Tissue  Result Date: 06/05/2019 CLINICAL DATA:  Status post stimulator placement EXAM: NECK SOFT TISSUES - 1+ VIEW COMPARISON:  None. FINDINGS: Stimulator wire extends from the right upper chest into the submental position on the right. Carotid calcifications are seen. No other focal abnormality is noted. IMPRESSION: Status post stimulator implantation.  No acute abnormality noted. Electronically Signed   By: Inez Catalina M.D.   On: 06/05/2019 19:52  Dg Chest Port 1 View  Result Date: 06/05/2019 CLINICAL DATA:  Status post stimulator placement EXAM: PORTABLE CHEST 1 VIEW COMPARISON:  None. FINDINGS: Cardiac shadows within normal limits. The lungs are well aerated bilaterally. Stimulator pack is noted over the right chest with a lead over the lower lung field and a second extending into the neck. No pneumothorax is noted. IMPRESSION: No complicating factors following stimulator placement Electronically Signed   By: Inez Catalina M.D.   On: 06/05/2019 19:52    Disposition: Discharge disposition: 01-Home or Self Care       Discharge Instructions    Diet - low sodium heart healthy   Complete by: As directed    Diet -  low sodium heart healthy   Complete by: As directed    Discharge instructions   Complete by: As directed    Postoperative Instructions after Inspire Implant:  1. Limited activity -do not lift arm above the shoulder for 2 weeks, no heavy lifting or straining for 4 weeks. 2. Liquid and soft diet, advance as tolerated 3. May bathe and shower day after surgery 4. Wound care - Gentle cleaning with soap and water 5. DO NOT APPLY ANY OINTMENT 6. Elevate Head of Bed 7. Wear arm sling for 72 hours after surgery  Please call [facility] at [phone] for any additional questions or concerns.   Increase activity slowly   Complete by: As directed    Increase activity slowly   Complete by: As directed       Follow-up Information    Jerrell Belfast, MD In 2 weeks.   Specialty: Otolaryngology Contact information: [address] [place] [address] [phone]            Signed: Jerrell Belfast 06/06/2019, 8:24 AM

## 2019-06-06 NOTE — Plan of Care (Signed)
  Problem: Education: Goal: Knowledge of General Education information will improve Description Including pain rating scale, medication(s)/side effects and non-pharmacologic comfort measures Outcome: Progressing   

## 2020-01-15 ENCOUNTER — Other Ambulatory Visit: Payer: Self-pay

## 2020-01-15 ENCOUNTER — Ambulatory Visit: Payer: Self-pay

## 2020-01-15 ENCOUNTER — Ambulatory Visit: Payer: Medicare Other | Admitting: Orthopaedic Surgery

## 2020-01-15 DIAGNOSIS — G8929 Other chronic pain: Secondary | ICD-10-CM

## 2020-01-15 DIAGNOSIS — M545 Low back pain: Secondary | ICD-10-CM | POA: Diagnosis not present

## 2020-01-15 NOTE — Progress Notes (Signed)
Office Visit Note   Patient: Ricky Zavala           Date of Birth: 07/12/1941           MRN: [id number] Visit Date: 01/15/2020              Requested by: Egbert Garibaldi, PA-C [address] [place],  [place] [address] PCP: Egbert Garibaldi, PA-C   Assessment & Plan: Visit Diagnoses:  1. Chronic bilateral low back pain, unspecified whether sciatica present     Plan: I talked with the patient about his clinical exam and his x-ray findings.  I do feel that his truncal obesity is contributing to the pain he is having in his lower back and is certainly suggested core strengthening exercises and weight loss.  Given his pain though, I do feel that he would benefit from bilateral facet joint injections most likely at L4-5 S1 bilaterally versus L4-L5.  Since he has seen Dr. Ernestina Patches in the past for his cervical spine I felt appropriate to send him to Dr. Ernestina Patches for his lumbar spine and I do not feel right now a MRI is warranted based on his clinical exam and x-ray findings.  Obviously if he develops any radicular symptoms that may change.  We will work on setting him up with an appointment with Dr. Ernestina Patches in the near future.  He agrees with this treatment plan.  All questions and concerns were answered and addressed.  Follow-Up Instructions: No follow-ups on file.   Orders:  Orders Placed This Encounter  Procedures  . XR Lumbar Spine 2-3 Views   No orders of the defined types were placed in this encounter.     Procedures: No procedures performed   Clinical Data: No additional findings.   Subjective: Chief Complaint  Patient presents with  . Lower Back - Pain  The patient is I am seeing for the first time but I seen him with family before.  He is also seeing Dr. Ernestina Patches in the past for steroid injections in his cervical spine.  He has a long history of chronic low back pain for over 20 years.  He has never had surgery on his lumbar spine at all.  He is never had any significant injuries that  he is aware of.  He has been to a chiropractor for many years now but he states that more recently interventions by the chiropractor have not helped.  He reports only lower lumbar spine pain with no radicular components at all.  He denies any change in bowel bladder function or weakness in his legs.  He denies any numbness and tingling in his feet.  A tunnel across the lower back is where he hurts the most.  He is not a diabetic.  He denies any active medical issues.  He is 79 years old and active.  HPI  Review of Systems He currently denies any headache, chest pain, shortness of breath, fever, chills, nausea, vomiting  Objective: Vital Signs: There were no vitals taken for this visit.  Physical Exam He is alert and orient x3 and in no acute distress Ortho Exam Examination of his lumbar spine shows pain in the paraspinal muscles of the lower lumbar spine.  This does limit flexion extension of the spine but there is no radicular component to his pain.  He has 5 out of 5 strength of his lower extremities with normal sensation and normal reflexes. Specialty Comments:  No specialty comments available.  Imaging:  XR Lumbar Spine 2-3 Views  Result Date: 01/15/2020 2 views of the lumbar spine shows significant degenerative changes at several levels.  There is a grade 1 spondylolisthesis of L5 on S1.  There is no malalignment otherwise.    PMFS History: Patient Active Problem List   Diagnosis Date Noted  . Obstructive sleep apnea 03/15/2019  . Cervical spondylosis without myelopathy 08/10/2018  . Spondylosis without myelopathy or radiculopathy, lumbar region 08/10/2018  . 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 Chronic bilateral low back pain without sciatica 04/18/2018   Past Medical History:  Diagnosis Date  . Anxiety   . Barrett's esophagus   . Cancer [facility])    prostate cancer-had surgery  . Chronic back pain   . COPD (chronic obstructive pulmonary disease) ([place])   . ETOH abuse     daily 2-3-vodka drinks  . GERD (gastroesophageal reflux disease)   . Hypercholesteremia   . Hypertension   . Sleep apnea    severe OSA uses CPAP nightly AHI 37.6    No family history on file.  Past Surgical History:  Procedure Laterality Date  . APPENDECTOMY    . CARDIAC CATHETERIZATION     care everywhere- August 2019  . COLONOSCOPY    . DRUG INDUCED ENDOSCOPY N/A 03/15/2019   Procedure: DRUG INDUCED ENDOSCOPY;  Surgeon: Jerrell Belfast, MD;  Location: [place];  Service: ENT;  Laterality: N/A;  Drug induced sleep endoscopy  . EYE SURGERY Bilateral   . GANGLION CYST EXCISION Left    wrist  . IMPLANTATION OF HYPOGLOSSAL NERVE STIMULATOR  06/05/2019   IMPLANTATION OF HYPOGLOSSAL NERVE STIMULATOR on 06/05/2019  . IMPLANTATION OF HYPOGLOSSAL NERVE STIMULATOR Right 06/05/2019   Procedure: IMPLANTATION OF HYPOGLOSSAL NERVE STIMULATOR;  Surgeon: Jerrell Belfast, MD;  Location: [place];  Service: ENT;  Laterality: Right;  right anterior lateral neck, right anterior chest, right lateral lower chest  . LASIK Right   . PROSTATECTOMY    . TONSILLECTOMY     Social History   Occupational History  . Not on file  Tobacco Use  . Smoking status: Former Smoker    Packs/day: 3.00    Quit date: 11/26/1993    Years since quitting: 26.1  . Smokeless tobacco: Never Used  Substance and Sexual Activity  . Alcohol use: Yes    Comment: drinks 2-3 vodka drinks every night  . Drug use: Not Currently  . Sexual activity: Not on file

## 2020-02-10 ENCOUNTER — Ambulatory Visit: Payer: Medicare Other | Admitting: Physical Medicine and Rehabilitation

## 2020-02-10 ENCOUNTER — Ambulatory Visit: Payer: Self-pay

## 2020-02-10 ENCOUNTER — Other Ambulatory Visit: Payer: Self-pay

## 2020-02-10 ENCOUNTER — Encounter: Payer: Self-pay | Admitting: Physical Medicine and Rehabilitation

## 2020-02-10 VITALS — BP 157/88 | HR 74

## 2020-02-10 DIAGNOSIS — M47816 Spondylosis without myelopathy or radiculopathy, lumbar region: Secondary | ICD-10-CM

## 2020-02-10 MED ORDER — BETAMETHASONE SOD PHOS & ACET 6 (3-3) MG/ML IJ SUSP: 12.0000 mg | Freq: Once | INTRAMUSCULAR | Status: DC

## 2020-02-10 MED ORDER — BUPIVACAINE HCL 0.5 % IJ SOLN: 3.0000 mL | Freq: Once | INTRAMUSCULAR | Status: DC

## 2020-02-10 NOTE — Progress Notes (Signed)
Pt states pain in the lower back with no leg pain. Pt states sitting and laying down helps with pain. Pt states standing and walking increase pain.  Numeric Pain Rating Scale and Functional Assessment Average Pain (7)   In the last MONTH (on 0-10 scale) has pain interfered with the following?  1. General activity like being  able to carry out your everyday physical activities such as walking, climbing stairs, carrying groceries, or moving a chair?  Rating(9)   +Driver, -BT, -Dye Allergies.

## 2020-02-11 DIAGNOSIS — I2510 Atherosclerotic heart disease of native coronary artery without angina pectoris: Secondary | ICD-10-CM | POA: Insufficient documentation

## 2020-02-11 NOTE — Procedures (Signed)
Lumbar Diagnostic Facet Joint Nerve Block with Fluoroscopic Guidance   Patient: Ricky Zavala      Date of Birth: 02-07-1941 MRN: [id number] PCP: Egbert Garibaldi, PA-C      Visit Date: 02/10/2020   Universal Protocol:    Date/Time: 05/25/216:04 AM  Consent Given By: the patient  Position: PRONE  Additional Comments: Vital signs were monitored before and after the procedure. Patient was prepped and draped in the usual sterile fashion. The correct patient, procedure, and site was verified.   Injection Procedure Details:  Procedure Site One Meds Administered:  Meds ordered this encounter  Medications  . bupivacaine (MARCAINE) 0.5 % (with pres) injection 3 mL  . betamethasone acetate-betamethasone sodium phosphate (CELESTONE) injection 12 mg     Laterality: Bilateral  Location/Site:  L4-L5 L5-S1  Needle size: 22 ga.  Needle type:spinal  Needle Placement: Oblique pedical  Findings:   -Comments: There was excellent flow of contrast along the articular pillars without intravascular flow.  Procedure Details: The fluoroscope beam is vertically oriented in AP and then obliqued 15 to 20 degrees to the ipsilateral side of the desired nerve to achieve the "Scotty dog" appearance.  The skin over the target area of the junction of the superior articulating process and the transverse process (sacral ala if blocking the L5 dorsal rami) was locally anesthetized with a 1 ml volume of 1% Lidocaine without Epinephrine.  The spinal needle was inserted and advanced in a trajectory view down to the target.   After contact with periosteum and negative aspirate for blood and CSF, correct placement without intravascular or epidural spread was confirmed by injecting 0.5 ml. of Isovue-250.  A spot radiograph was obtained of this image.    Next, a 0.5 ml. volume of the injectate described above was injected. The needle was then redirected to the other facet joint nerves mentioned above if  needed.  Prior to the procedure, the patient was given a Pain Diary which was completed for baseline measurements.  After the procedure, the patient rated their pain every 30 minutes and will continue rating at this frequency for a total of 5 hours.  The patient has been asked to complete the Diary and return to [place] by mail, fax or hand delivered as soon as possible.   Additional Comments:  The patient tolerated the procedure well Dressing: 2 x 2 sterile gauze and Band-Aid    Post-procedure details: Patient was observed during the procedure. Post-procedure instructions were reviewed.  Patient left the clinic in stable condition.

## 2020-02-11 NOTE — Progress Notes (Signed)
Ricky Zavala - 79 y.o. male MRN [id number]  Date of birth: 1941/02/11  Office Visit Note: Visit Date: 02/10/2020 PCP: Egbert Garibaldi, PA-C Referred by: Egbert Garibaldi, PA-C  Subjective: Chief Complaint  Patient presents with  . Lower Back - Pain   HPI: Ricky Zavala is a 79 y.o. male who comes in today For evaluation management of chronic worsening axial low back pain.  Patient is seen by Dr. Jean Rosenthal in the office from an orthopedic standpoint.  He has been having chronic worsening low back pain now for several weeks without specific injury or onset.  Worse with standing and ambulating.  No radicular pain down the legs.  Has prior MRI from a few years ago showing mainly facet arthropathy and degenerative changes without lumbar stenosis.  Patient did have prior radiofrequency ablation approximately a year and a half ago with good relief of his symptoms.  Do to the new onset which was more abrupt we decided to complete diagnostic medial branch blocks once again at L4-5 and L5-S1 given the timing of the prior ablation.  If he does well with this I would look to get preauthorization for repeat radiofrequency ablation.  The ablation before did give him more than 50% relief.  Exam today is consistent with facet mediated pain.  ROS Otherwise per HPI.  Assessment & Plan: Visit Diagnoses:  1. Spondylosis without myelopathy or radiculopathy, lumbar region     Plan: Findings:  Do to the new onset which was more abrupt we decided to complete diagnostic medial branch blocks once again at L4-5 and L5-S1 given the timing of the prior ablation.  If he does well with this I would look to get preauthorization for repeat radiofrequency ablation.  The ablation before did give him more than 50% relief.  Exam today is consistent with facet mediated pain.     Meds & Orders:  Meds ordered this encounter  Medications  . bupivacaine (MARCAINE) 0.5 % (with pres) injection 3 mL  . betamethasone  acetate-betamethasone sodium phosphate (CELESTONE) injection 12 mg    Orders Placed This Encounter  Procedures  . Facet Injection  . XR C-ARM NO REPORT    Follow-up: Return for Review Pain Diary.   Procedures: No procedures performed  Lumbar Diagnostic Facet Joint Nerve Block with Fluoroscopic Guidance   Patient: Ricky Zavala      Date of Birth: [birth year] MRN: [id number] PCP: Egbert Garibaldi, PA-C      Visit Date: 02/10/2020   Universal Protocol:    Date/Time: 05/25/216:04 AM  Consent Given By: the patient  Position: PRONE  Additional Comments: Vital signs were monitored before and after the procedure. Patient was prepped and draped in the usual sterile fashion. The correct patient, procedure, and site was verified.   Injection Procedure Details:  Procedure Site One Meds Administered:  Meds ordered this encounter  Medications  . bupivacaine (MARCAINE) 0.5 % (with pres) injection 3 mL  . betamethasone acetate-betamethasone sodium phosphate (CELESTONE) injection 12 mg     Laterality: Bilateral  Location/Site:  L4-L5 L5-S1  Needle size: 22 ga.  Needle type:spinal  Needle Placement: Oblique pedical  Findings:   -Comments: There was excellent flow of contrast along the articular pillars without intravascular flow.  Procedure Details: The fluoroscope beam is vertically oriented in AP and then obliqued 15 to 20 degrees to the ipsilateral side of the desired nerve to achieve the "Scotty dog" appearance.  The skin over the target area of the junction of  the superior articulating process and the transverse process (sacral ala if blocking the L5 dorsal rami) was locally anesthetized with a 1 ml volume of 1% Lidocaine without Epinephrine.  The spinal needle was inserted and advanced in a trajectory view down to the target.   After contact with periosteum and negative aspirate for blood and CSF, correct placement without intravascular or epidural spread was confirmed by  injecting 0.5 ml. of Isovue-250.  A spot radiograph was obtained of this image.    Next, a 0.5 ml. volume of the injectate described above was injected. The needle was then redirected to the other facet joint nerves mentioned above if needed.  Prior to the procedure, the patient was given a Pain Diary which was completed for baseline measurements.  After the procedure, the patient rated their pain every 30 minutes and will continue rating at this frequency for a total of 5 hours.  The patient has been asked to complete the Diary and return to [place] by mail, fax or hand delivered as soon as possible.   Additional Comments:  The patient tolerated the procedure well Dressing: 2 x 2 sterile gauze and Band-Aid    Post-procedure details: Patient was observed during the procedure. Post-procedure instructions were reviewed.  Patient left the clinic in stable condition.    Clinical History: MRI LUMBAR SPINE WITHOUT CONTRAST    TECHNIQUE:  Multiplanar, multisequence MR imaging of the lumbar spine was  performed. No intravenous contrast was administered.    COMPARISON: None.    FINDINGS:  Segmentation: The lowest lumbar type non-rib-bearing vertebra is  labeled as L5.    Alignment: 7 mm of anterolisthesis at L5-S1 associated with chronic  bilateral pars defects.    4 mm degenerative retrolisthesis at L2-3 with 2 mm degenerative  retrolisthesis L3-4.    Vertebrae: Marrow heterogeneity is present. Although this can be  caused by marrow infiltrative processes, the most common causes  include anemia, smoking, obesity, or advancing age.    1.5 cm in diameter hemangioma in the T12 vertebral body.    Type 1 degenerative endplate findings along the left inferior  endplate of L4. Type 2 degenerative endplate findings at 075-GRM.    Conus medullaris and cauda equina: Conus extends to the L1 level.  Conus and cauda equina appear normal.    Paraspinal and other soft tissues: Fluid  signal intensity lesions of  the left kidney including what is probably a larger lesion along the  lower pole. These may well be cysts better technically nonspecific  and only partially included in imaging.    No pathologic retroperitoneal adenopathy is observed. No appreciable  abdominal aortic aneurysm.    Disc levels:    L1-2: No impingement. Mild disc bulge.    L2-3: No impingement. Diffuse disc bulge.    L3-4: Borderline bilateral subarticular lateral recess stenosis due  to disc bulge and facet arthropathy.    L4-5: Borderline right subarticular lateral recess stenosis due to  disc bulge.    L5-S1: Mild to moderate bilateral foraminal stenosis due to disc  uncovering and subluxation along with mild facet arthropathy. This  is slightly worse on the right compared to the left.    IMPRESSION:  1. 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  2. Borderline impingement at L3-4 and L4-5 due to spondylosis and  degenerative disc disease.  3. Marrow heterogeneity  is present. Although this can be caused by  marrow infiltrative processes, the most common causes include  anemia, smoking, obesity, or advancing age.  4. We image the margin of several fluid signal intensity lesions in  the vicinity of the left kidney. These are probably cysts but only a  small minority of the volume of these lesions is included on today's  imaging.      Electronically Signed  By: Van Clines M.D.  On: 05/02/2018 16:55   He reports that he quit smoking about 26 years ago. He smoked 3.00 packs per day. He has never used smokeless tobacco. No results for input(s): HGBA1C, LABURIC in the last 8760 hours.  Objective:  VS:  HT:    WT:   BMI:     BP:(!) 157/88  HR:74bpm  TEMP: ( )  RESP:  Physical Exam Constitutional:      General: He is not  in acute distress.    Appearance: Normal appearance. He is not ill-appearing.  HENT:     Head: Normocephalic and atraumatic.     Right Ear: External ear normal.     Left Ear: External ear normal.  Eyes:     Extraocular Movements: Extraocular movements intact.  Cardiovascular:     Rate and Rhythm: Normal rate.     Pulses: Normal pulses.  Abdominal:     General: There is no distension.     Palpations: Abdomen is soft.  Musculoskeletal:        General: No tenderness or signs of injury.     Right lower leg: No edema.     Left lower leg: No edema.     Comments: Patient has good distal strength without clonus.  Patient is slow to rise from a seated position to full extension.  He has concordant low back pain with facet loading and extension.  Skin:    Findings: No erythema or rash.  Neurological:     General: No focal deficit present.     Mental Status: He is alert and oriented to person, place, and time.     Sensory: No sensory deficit.     Motor: No weakness or abnormal muscle tone.     Coordination: Coordination normal.  Psychiatric:        Mood and Affect: Mood normal.        Behavior: Behavior normal.     Ortho Exam  Imaging: XR C-ARM NO REPORT  Result Date: 02/10/2020 Please see Notes tab for imaging impression.   Past Medical/Family/Surgical/Social History: Medications & Allergies reviewed per EMR, new medications updated. Patient Active Problem List   Diagnosis Date Noted  . Obstructive sleep apnea 03/15/2019  . Cervical spondylosis without myelopathy 08/10/2018  . Spondylosis without myelopathy or radiculopathy, lumbar region 08/10/2018  . 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 Chronic bilateral low back pain without sciatica 04/18/2018   Past Medical History:  Diagnosis Date  . Anxiety   . Barrett's esophagus   . Cancer [facility])    prostate cancer-had surgery  . Chronic back pain   . COPD (chronic obstructive pulmonary disease) ([place])   . ETOH abuse    daily  2-3-vodka drinks  . GERD (gastroesophageal reflux disease)   . Hypercholesteremia   . Hypertension   . Sleep apnea    severe OSA uses CPAP nightly AHI 37.6   History reviewed. No pertinent family history. Past Surgical History:  Procedure Laterality Date  . APPENDECTOMY    . CARDIAC CATHETERIZATION     care  everywhere- August 2019  . COLONOSCOPY    . DRUG INDUCED ENDOSCOPY N/A 03/15/2019   Procedure: DRUG INDUCED ENDOSCOPY;  Surgeon: Jerrell Belfast, MD;  Location: [place];  Service: ENT;  Laterality: N/A;  Drug induced sleep endoscopy  . EYE SURGERY Bilateral   . GANGLION CYST EXCISION Left    wrist  . IMPLANTATION OF HYPOGLOSSAL NERVE STIMULATOR  06/05/2019   IMPLANTATION OF HYPOGLOSSAL NERVE STIMULATOR on 06/05/2019  . IMPLANTATION OF HYPOGLOSSAL NERVE STIMULATOR Right 06/05/2019   Procedure: IMPLANTATION OF HYPOGLOSSAL NERVE STIMULATOR;  Surgeon: Jerrell Belfast, MD;  Location: [place];  Service: ENT;  Laterality: Right;  right anterior lateral neck, right anterior chest, right lateral lower chest  . LASIK Right   . PROSTATECTOMY    . TONSILLECTOMY     Social History   Occupational History  . Not on file  Tobacco Use  . Smoking status: Former Smoker    Packs/day: 3.00    Quit date: 11/26/1993    Years since quitting: 26.2  . Smokeless tobacco: Never Used  Substance and Sexual Activity  . Alcohol use: Yes    Comment: drinks 2-3 vodka drinks every night  . Drug use: Not Currently  . Sexual activity: Not on file

## 2020-02-20 ENCOUNTER — Telehealth: Payer: Self-pay | Admitting: *Deleted

## 2020-02-20 NOTE — Telephone Encounter (Signed)
Called pt and lvm #1 

## 2020-03-24 ENCOUNTER — Telehealth: Payer: Self-pay | Admitting: Physical Medicine and Rehabilitation

## 2020-03-24 NOTE — Telephone Encounter (Signed)
Pt called wanting another injection, Pt had Bil L4-5 facet 02/07/20 ok to repeat?

## 2020-03-24 NOTE — Telephone Encounter (Signed)
Patient called. He would like an appointment with Dr. Ernestina Patches. His call back number is [phone]

## 2020-03-25 ENCOUNTER — Telehealth: Payer: Self-pay | Admitting: Physical Medicine and Rehabilitation

## 2020-03-25 NOTE — Telephone Encounter (Signed)
Called pt, pt is schedule for 04/20/20 @ 2:30pm with driver.

## 2020-03-25 NOTE — Telephone Encounter (Signed)
Please Advise

## 2020-03-25 NOTE — Telephone Encounter (Signed)
Called pt and lvm #1 

## 2020-03-25 NOTE — Telephone Encounter (Signed)
Ok if criteria met 

## 2020-03-25 NOTE — Telephone Encounter (Signed)
[id number], Injection(s), anesthetic agent and/or more  Notification/Prior Authorization not required if procedure performed in Office; otherwise may be required for this service. 

## 2020-03-25 NOTE — Telephone Encounter (Signed)
Patient called.   Returning a call to make an appointment.   Call back: [phone]

## 2020-03-27 NOTE — Telephone Encounter (Signed)
Patient scheduled 04/20/20 at 230pm

## 2020-04-20 ENCOUNTER — Encounter: Payer: Self-pay | Admitting: Physical Medicine and Rehabilitation

## 2020-04-20 ENCOUNTER — Ambulatory Visit: Payer: Medicare Other | Admitting: Physical Medicine and Rehabilitation

## 2020-04-20 ENCOUNTER — Other Ambulatory Visit: Payer: Self-pay

## 2020-04-20 ENCOUNTER — Ambulatory Visit: Payer: Self-pay

## 2020-04-20 VITALS — BP 171/92 | HR 68

## 2020-04-20 DIAGNOSIS — M47816 Spondylosis without myelopathy or radiculopathy, lumbar region: Secondary | ICD-10-CM | POA: Diagnosis not present

## 2020-04-20 MED ORDER — METHYLPREDNISOLONE ACETATE 80 MG/ML IJ SUSP: 80.0000 mg | Freq: Once | INTRAMUSCULAR | Status: DC

## 2020-04-20 NOTE — Progress Notes (Signed)
Pt states lower back. Pt states walking and standing makes the pain worse. Pt state  Sitting down and meds help with the pain. Pt has hx of inj on 02/10/20 he said it felt great. Pt states that his pain came back at the end June.  Numeric Pain Rating Scale and Functional Assessment Average Pain 2   In the last MONTH (on 0-10 scale) has pain interfered with the following?  1. General activity like being  able to carry out your everyday physical activities such as walking, climbing stairs, carrying groceries, or moving a chair?  Rating(8)   +Driver, -BT, -Dye Allergies.

## 2020-04-21 NOTE — Progress Notes (Signed)
Ricky Zavala - 79 y.o. male MRN [id number]  Date of birth: [birth year]  Office Visit Note: Visit Date: 04/20/2020 PCP: Egbert Garibaldi, PA-C Referred by: Egbert Garibaldi, PA-C  Subjective: No chief complaint on file.  HPI:  Ricky Zavala is a 79 y.o. male who comes in today for planned repeat Bilateral L4-L5 Lumbar facet/medial branch block with fluoroscopic guidance.  The patient has failed conservative care including home exercise, medications, time and activity modification.  This injection will be diagnostic and hopefully therapeutic.  Please see requesting physician notes for further details and justification.  Exam shows concordant low back pain with facet joint loading and extension. Patient received more than 80% pain relief from prior injection.  We will get approval for radiofrequency ablation of the bilateral L4-5 facet joints pending outcome of pain diary and results of this injection.     Referring:Dr. Jean Rosenthal   We did talk for a short length about patient having chronic plantar fasciitis.  He has been seeing a podiatrist and they have looked at possible surgery.  As a second opinion I suggested potential to see Dr. Eunice Blase in the office for evaluation management and consultation.  Not sure of the current evidence for PRP or other nonsurgical interventions.  Patient is really had all other manner of conservative care for his feet.  ROS Otherwise per HPI.  Assessment & Plan: Visit Diagnoses:  1. Spondylosis without myelopathy or radiculopathy, lumbar region     Plan: No additional findings.   Meds & Orders:  Meds ordered this encounter  Medications  . methylPREDNISolone acetate (DEPO-MEDROL) injection 80 mg    Orders Placed This Encounter  Procedures  . Facet Injection  . XR C-ARM NO REPORT    Follow-up: Return for Review Pain Diary.   Procedures: No procedures performed  Lumbar Diagnostic Facet Joint Nerve Block with Fluoroscopic Guidance    Patient: Ricky Zavala      Date of Birth: 12/15/1940 MRN: [id number] PCP: Egbert Garibaldi, PA-C      Visit Date: 04/20/2020   Universal Protocol:    Date/Time: 08/03/216:29 AM  Consent Given By: the patient  Position: PRONE  Additional Comments: Vital signs were monitored before and after the procedure. Patient was prepped and draped in the usual sterile fashion. The correct patient, procedure, and site was verified.   Injection Procedure Details:  Procedure Site One Meds Administered:  Meds ordered this encounter  Medications  . methylPREDNISolone acetate (DEPO-MEDROL) injection 80 mg     Laterality: Bilateral  Location/Site:  L4-L5  Needle size: 22 ga.  Needle type:spinal  Needle Placement: Oblique pedical  Findings:   -Comments: There was excellent flow of contrast along the articular pillars without intravascular flow.  Procedure Details: The fluoroscope beam is vertically oriented in AP and then obliqued 15 to 20 degrees to the ipsilateral side of the desired nerve to achieve the "Scotty dog" appearance.  The skin over the target area of the junction of the superior articulating process and the transverse process (sacral ala if blocking the L5 dorsal rami) was locally anesthetized with a 1 ml volume of 1% Lidocaine without Epinephrine.  The spinal needle was inserted and advanced in a trajectory view down to the target.   After contact with periosteum and negative aspirate for blood and CSF, correct placement without intravascular or epidural spread was confirmed by injecting 0.5 ml. of Isovue-250.  A spot radiograph was obtained of this image.    Next, a 0.5  ml. volume of the injectate described above was injected. The needle was then redirected to the other facet joint nerves mentioned above if needed.  Prior to the procedure, the patient was given a Pain Diary which was completed for baseline measurements.  After the procedure, the patient rated their pain  every 30 minutes and will continue rating at this frequency for a total of 5 hours.  The patient has been asked to complete the Diary and return to [place] by mail, fax or hand delivered as soon as possible.   Additional Comments:  The patient tolerated the procedure well Dressing: 2 x 2 sterile gauze and Band-Aid    Post-procedure details: Patient was observed during the procedure. Post-procedure instructions were reviewed.  Patient left the clinic in stable condition.    Clinical History: MRI LUMBAR SPINE WITHOUT CONTRAST    TECHNIQUE:  Multiplanar, multisequence MR imaging of the lumbar spine was  performed. No intravenous contrast was administered.    COMPARISON: None.    FINDINGS:  Segmentation: The lowest lumbar type non-rib-bearing vertebra is  labeled as L5.    Alignment: 7 mm of anterolisthesis at L5-S1 associated with chronic  bilateral pars defects.    4 mm degenerative retrolisthesis at L2-3 with 2 mm degenerative  retrolisthesis L3-4.    Vertebrae: Marrow heterogeneity is present. Although this can be  caused by marrow infiltrative processes, the most common causes  include anemia, smoking, obesity, or advancing age.    1.5 cm in diameter hemangioma in the T12 vertebral body.    Type 1 degenerative endplate findings along the left inferior  endplate of L4. Type 2 degenerative endplate findings at P2-R5.    Conus medullaris and cauda equina: Conus extends to the L1 level.  Conus and cauda equina appear normal.    Paraspinal and other soft tissues: Fluid signal intensity lesions of  the left kidney including what is probably a larger lesion along the  lower pole. These may well be cysts better technically nonspecific  and only partially included in imaging.    No pathologic retroperitoneal adenopathy is observed. No appreciable  abdominal aortic aneurysm.    Disc levels:    L1-2: No impingement. Mild disc bulge.    L2-3: No impingement.  Diffuse disc bulge.    L3-4: Borderline bilateral subarticular lateral recess stenosis due  to disc bulge and facet arthropathy.    L4-5: Borderline right subarticular lateral recess stenosis due to  disc bulge.    L5-S1: Mild to moderate bilateral foraminal stenosis due to disc  uncovering and subluxation along with mild facet arthropathy. This  is slightly worse on the right compared to the left.    IMPRESSION:  1. 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  2. Borderline impingement at L3-4 and L4-5 due to spondylosis and  degenerative disc disease.  3. Marrow heterogeneity is present. Although this can be caused by  marrow infiltrative processes, the most common causes include  anemia, smoking, obesity, or advancing age.  4. We image the margin of several fluid signal intensity lesions in  the vicinity of the left kidney. These are probably cysts but only a  small minority of the volume of these lesions is included on today's  imaging.      Electronically Signed  By: Van Clines M.D.  On: 05/02/2018 16:55     Objective:  VS:  HT:    WT:   BMI:     BP:(!) 171/92  HR:68bpm  TEMP: ( )  RESP:  Physical Exam Constitutional:      General: He is not in acute distress.    Appearance: Normal appearance. He is not ill-appearing.  HENT:     Head: Normocephalic and atraumatic.     Right Ear: External ear normal.     Left Ear: External ear normal.  Eyes:     Extraocular Movements: Extraocular movements intact.  Cardiovascular:     Rate and Rhythm: Normal rate.     Pulses: Normal pulses.  Abdominal:     General: There is no distension.     Palpations: Abdomen is soft.  Musculoskeletal:        General: No tenderness or signs of injury.     Right lower leg: No edema.     Left lower leg: No edema.     Comments: Patient  has good distal strength without clonus. Patient somewhat slow to rise from a seated position to full extension.  There is concordant low back pain with facet loading and lumbar spine extension rotation.  There are no definitive trigger points but the patient is somewhat tender across the lower back and PSIS.  There is no pain with hip rotation.  Skin:    Findings: No erythema or rash.  Neurological:     General: No focal deficit present.     Mental Status: He is alert and oriented to person, place, and time.     Sensory: No sensory deficit.     Motor: No weakness or abnormal muscle tone.     Coordination: Coordination normal.  Psychiatric:        Mood and Affect: Mood normal.        Behavior: Behavior normal.      Imaging: XR C-ARM NO REPORT  Result Date: 04/20/2020 Please see Notes tab for imaging impression.

## 2020-04-21 NOTE — Procedures (Signed)
Lumbar Diagnostic Facet Joint Nerve Block with Fluoroscopic Guidance   Patient: Ricky Zavala      Date of Birth: 07-01-1941 MRN: [id number] PCP: Egbert Garibaldi, PA-C      Visit Date: 04/20/2020   Universal Protocol:    Date/Time: 08/03/216:29 AM  Consent Given By: the patient  Position: PRONE  Additional Comments: Vital signs were monitored before and after the procedure. Patient was prepped and draped in the usual sterile fashion. The correct patient, procedure, and site was verified.   Injection Procedure Details:  Procedure Site One Meds Administered:  Meds ordered this encounter  Medications  . methylPREDNISolone acetate (DEPO-MEDROL) injection 80 mg     Laterality: Bilateral  Location/Site:  L4-L5  Needle size: 22 ga.  Needle type:spinal  Needle Placement: Oblique pedical  Findings:   -Comments: There was excellent flow of contrast along the articular pillars without intravascular flow.  Procedure Details: The fluoroscope beam is vertically oriented in AP and then obliqued 15 to 20 degrees to the ipsilateral side of the desired nerve to achieve the "Scotty dog" appearance.  The skin over the target area of the junction of the superior articulating process and the transverse process (sacral ala if blocking the L5 dorsal rami) was locally anesthetized with a 1 ml volume of 1% Lidocaine without Epinephrine.  The spinal needle was inserted and advanced in a trajectory view down to the target.   After contact with periosteum and negative aspirate for blood and CSF, correct placement without intravascular or epidural spread was confirmed by injecting 0.5 ml. of Isovue-250.  A spot radiograph was obtained of this image.    Next, a 0.5 ml. volume of the injectate described above was injected. The needle was then redirected to the other facet joint nerves mentioned above if needed.  Prior to the procedure, the patient was given a Pain Diary which was completed for baseline  measurements.  After the procedure, the patient rated their pain every 30 minutes and will continue rating at this frequency for a total of 5 hours.  The patient has been asked to complete the Diary and return to [place] by mail, fax or hand delivered as soon as possible.   Additional Comments:  The patient tolerated the procedure well Dressing: 2 x 2 sterile gauze and Band-Aid    Post-procedure details: Patient was observed during the procedure. Post-procedure instructions were reviewed.  Patient left the clinic in stable condition.

## 2020-04-28 ENCOUNTER — Ambulatory Visit: Payer: Self-pay

## 2020-04-28 ENCOUNTER — Other Ambulatory Visit: Payer: Self-pay

## 2020-04-28 ENCOUNTER — Encounter: Payer: Self-pay | Admitting: Family Medicine

## 2020-04-28 ENCOUNTER — Ambulatory Visit: Payer: Medicare Other | Admitting: Family Medicine

## 2020-04-28 ENCOUNTER — Telehealth: Payer: Self-pay | Admitting: Physical Medicine and Rehabilitation

## 2020-04-28 DIAGNOSIS — M79671 Pain in right foot: Secondary | ICD-10-CM

## 2020-04-28 NOTE — Telephone Encounter (Signed)
I think they get it, but YES, we need pain diary OR did help etc. Then do RFA

## 2020-04-28 NOTE — Telephone Encounter (Signed)
Patient asked for a call to set an appt for back injections. Please call patient at [phone].

## 2020-04-28 NOTE — Telephone Encounter (Signed)
Bilateral L5-S1 facets. I have not seen pain diary. Please advise.

## 2020-04-28 NOTE — Progress Notes (Signed)
Office Visit Note   Patient: Ricky Zavala           Date of Birth: 11-17-1940           MRN: [id number] Visit Date: 04/28/2020 Requested by: Egbert Garibaldi, PA-C [address] [place],  [place] [address] PCP: Egbert Garibaldi, PA-C  Subjective: Chief Complaint  Patient presents with   Right Foot - Pain    Chronic plantar fasciitis x 20 years. Consultation for 2nd opinion - something besides surgery.    HPI: He is here with chronic right foot pain.  Longstanding problems with his foot for many years.  He has seen several different doctors and has had multiple injections.  The first injection was on the dorsal aspect of his midfoot and it helped for about a year.  When the pain came back he was given an injection on the plantar aspect of his foot.  He has had another one recently in the region of the tarsal tunnel.  The latest injections have not been helping.  He recently saw Dr. Ernestina Patches for lumbar epidural injection and asked if he could inject his foot as well, but Dr. Ernestina Patches referred him here.  Patient is not having any physical pain.  His pain is on the plantar aspect of his arch near the great toe MTP joint.  No pain at rest.               ROS:   All other systems were reviewed and are negative.  Objective: Vital Signs: There were no vitals taken for this visit.  Physical Exam:  General:  Alert and oriented, in no acute distress. Pulm:  Breathing unlabored. Psy:  Normal mood, congruent affect. Skin: No rash Right foot: He has extremely tight hamstrings and heel cords.  Normal-appearing arches at rest.  He is tender to palpation of the FHL tendon just proximal to the first MTP joint.  Palpation here seems to reproduce his pain.  Tenderness at the heel, no tenderness at the tarsal tunnel and no pain with manipulation of the midfoot.  Imaging: US Guided Needle Placement  Result Date: 04/28/2020 Limited diagnostic ultrasound of the right foot in the area of tenderness reveals slight  swelling in the flexor tendon sheath of the FHL.  No obvious tear seen.  There is hyperemia on power Doppler imaging. Ultrasound-guided injection: After sterile prep with Betadine, injected 3 cc 1% lidocaine without epinephrine and 20 mg methylprednisolone into the tendon sheath of the FHL.   Assessment & Plan: 1.  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  I think his problems are largely related to extreme inflexibility. -Elected to inject the FHL tendon sheath today.  He will start working aggressively on hamstring and heel cord flexibility.  If he fails to improve, consider consultation with Dr. Sharol Given for possible gastroc lengthening.     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   DOE (dyspnea on exertion) 05/07/2018   Chronic bilateral low back pain without sciatica 04/18/2018   Abnormal EKG 01/29/2018   Chest pain 01/29/2018   Hyponatremia 01/29/2018   Left ventricular hypertrophy 01/29/2018  ED (erectile dysfunction) 06/29/2012   Essential hypertension 06/29/2012   Barrett's esophagus 06/29/2012   Hypercholesterolemia 06/29/2012   Lower urinary tract symptoms (LUTS) 06/29/2012   Prostate cancer ([facility]) 06/29/2012   SUI (stress urinary incontinence), male 06/29/2012   Past Medical History:  Diagnosis Date   Anxiety    Barrett's esophagus    Cancer ([facility])    prostate cancer-had surgery   Chronic back pain    COPD (chronic obstructive pulmonary disease) ([facility])    ETOH abuse    daily 2-3-vodka drinks   GERD (gastroesophageal reflux disease)     Hypercholesteremia    Hypertension    Sleep apnea    severe OSA uses CPAP nightly AHI 37.6    History reviewed. No pertinent family history.  Past Surgical History:  Procedure Laterality Date   APPENDECTOMY     CARDIAC CATHETERIZATION     care everywhere- August 2019   COLONOSCOPY     DRUG INDUCED ENDOSCOPY N/A 03/15/2019   Procedure: DRUG INDUCED ENDOSCOPY;  Surgeon: Jerrell Belfast, MD;  Location: [facility];  Service: ENT;  Laterality: N/A;  Drug induced sleep endoscopy   EYE SURGERY Bilateral    GANGLION CYST EXCISION Left    wrist   IMPLANTATION OF HYPOGLOSSAL NERVE STIMULATOR  06/05/2019   IMPLANTATION OF HYPOGLOSSAL NERVE STIMULATOR on 06/05/2019   IMPLANTATION OF HYPOGLOSSAL NERVE STIMULATOR Right 06/05/2019   Procedure: IMPLANTATION OF HYPOGLOSSAL NERVE STIMULATOR;  Surgeon: Jerrell Belfast, MD;  Location: [facility];  Service: ENT;  Laterality: Right;  right anterior lateral neck, right anterior chest, right lateral lower chest   LASIK Right    PROSTATECTOMY     TONSILLECTOMY     Social History   Occupational History   Not on file  Tobacco Use   Smoking status: Former Smoker    Packs/day: 3.00    Quit date: 11/26/1993    Years since quitting: 26.4   Smokeless tobacco: Never Used  Vaping Use   Vaping Use: Never used  Substance and Sexual Activity   Alcohol use: Yes    Comment: drinks 2-3 vodka drinks every night   Drug use: Not Currently   Sexual activity: Not on file

## 2020-04-29 ENCOUNTER — Telehealth: Payer: Self-pay

## 2020-04-29 NOTE — Telephone Encounter (Signed)
Left message #1

## 2020-04-29 NOTE — Telephone Encounter (Signed)
Patient  Called in returning call

## 2020-04-30 NOTE — Telephone Encounter (Signed)
Needs auth and scheduling for [phone]. Bilateral L4-5 RFA.

## 2020-05-04 ENCOUNTER — Telehealth: Payer: Self-pay | Admitting: Family Medicine

## 2020-05-04 NOTE — Telephone Encounter (Signed)
Patient called asking for Dr. Junius Roads to refer to see Dr. Sharol Given about his feet. Patient would like to speak to Dr. Sharol Given about  Surgery on his feet. Please call patient about this matter at [phone].

## 2020-05-04 NOTE — Telephone Encounter (Signed)
The patient can actually just schedule an appointment with Dr. Sharol Given - no referral necessary. Please call him to schedule.

## 2020-05-05 ENCOUNTER — Telehealth: Payer: Self-pay | Admitting: Family Medicine

## 2020-05-05 NOTE — Telephone Encounter (Signed)
Called patient and left VM for him to call to set appt to see Dr. Sharol Given for consultation of feet pains. Will try another time.

## 2020-05-07 ENCOUNTER — Telehealth: Payer: Self-pay | Admitting: Orthopedic Surgery

## 2020-05-07 NOTE — Telephone Encounter (Signed)
Called and left VM on patient phone about setting an appt with Dr. Sharol Given about complaint of feet pains and consultation of surgery. Will try another time to contact patient.

## 2020-05-11 ENCOUNTER — Ambulatory Visit: Payer: Medicare Other | Admitting: Orthopedic Surgery

## 2020-05-12 ENCOUNTER — Ambulatory Visit (INDEPENDENT_AMBULATORY_CARE_PROVIDER_SITE_OTHER): Payer: Medicare Other

## 2020-05-12 ENCOUNTER — Encounter: Payer: Self-pay | Admitting: Orthopedic Surgery

## 2020-05-12 ENCOUNTER — Ambulatory Visit: Payer: Medicare Other | Admitting: Orthopedic Surgery

## 2020-05-12 VITALS — Ht 71.0 in | Wt 214.0 lb

## 2020-05-12 DIAGNOSIS — M79671 Pain in right foot: Secondary | ICD-10-CM

## 2020-05-12 DIAGNOSIS — M6702 Short Achilles tendon (acquired), left ankle: Secondary | ICD-10-CM

## 2020-05-14 ENCOUNTER — Encounter: Payer: Self-pay | Admitting: Orthopedic Surgery

## 2020-05-14 NOTE — Progress Notes (Signed)
Office Visit Note   Patient: Ricky Zavala           Date of Birth: 06/29/1941           MRN: [id number] Visit Date: 05/12/2020              Requested by: Egbert Garibaldi, PA-C [address] [place],  [place] [address] PCP: Egbert Garibaldi, PA-C  Chief Complaint  Patient presents with  . Right Foot - Pain      HPI: Patient is an 79 year old gentleman who presents with chronic pain plantar aspect of the right foot.  Patient states that he is being treated in a fracture boot by his podiatrist he states he has had several steroid injections the first were great the second did nothing.  Patient states he has been active with racquet sports including tennis and pickleball.  Patient states that in total he is seen 5 podiatrist with multiple injections.  He has worked with [employer].  Assessment & Plan: Visit Diagnoses:  1. Pain in right foot   2. Achilles tendon contracture, left     Plan: Discussed with the patient he does have Achilles contracture and atrophy of his plantar fascia.  Patient was given instructions and demonstrated Achilles stretching recommended fascial strengthening and this was also demonstrated.  Follow-up in 2 months.  Discussed that if he cannot passively stretch the Achilles that a gastrocnemius recession is an option.  Follow-Up Instructions: Return in about 2 months (around 07/12/2020).   Ortho Exam  Patient is alert, oriented, no adenopathy, well-dressed, normal affect, normal respiratory effort. Examination patient's foot is plantigrade with his knee extended he lacks about 20 degrees to neutral with a tight Achilles.  Patient has a very soft plantar fascia with decreased turgor and tension.  There is no cellulitis he has no neuroma in the forefoot there is no tenderness to palpation over the origin of the plantar fascia but does have generalized tenderness along the atrophied plantar fascia.  Imaging: No results found. No images are attached to the  encounter.  Labs: No results found for: HGBA1C, ESRSEDRATE, CRP, LABURIC, REPTSTATUS, GRAMSTAIN, CULT, LABORGA   Lab Results  Component Value Date   ALBUMIN 3.6 06/03/2019    No results found for: MG No results found for: VD25OH  No results found for: PREALBUMIN CBC EXTENDED Latest Ref Rng & Units 06/03/2019  WBC 4.0 - 10.5 K/uL 7.6  RBC 4.22 - 5.81 MIL/uL 4.07(L)  HGB 13.0 - 17.0 g/dL 13.3  HCT 39 - 52 % 40.3  PLT 150 - 400 K/uL 205     Body mass index is 29.85 kg/m.  Orders:  Orders Placed This Encounter  Procedures  . XR Foot 2 Views Right   No orders of the defined types were placed in this encounter.    Procedures: No procedures performed  Clinical Data: No additional findings.  ROS:  All other systems negative, except as noted in the HPI. Review of Systems  Objective: Vital Signs: Ht 5\' 11"  (1.803 m)   Wt 214 lb (97.1 kg)   BMI 29.85 kg/m   Specialty Comments:  No specialty comments available.  PMFS History: Patient Active Problem List   Diagnosis Date Noted  . Coronary artery disease involving native coronary artery of native heart without angina pectoris 02/11/2020  . Obstructive sleep apnea 03/15/2019  . Cervical spondylosis without myelopathy 08/10/2018  . Spondylosis without myelopathy or radiculopathy, lumbar region 08/10/2018  . 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  DOE (dyspnea on exertion) 05/07/2018  . Chronic bilateral low back pain without sciatica 04/18/2018  . Abnormal EKG 01/29/2018  . Chest pain 01/29/2018  . Hyponatremia 01/29/2018  . Left ventricular hypertrophy 01/29/2018  . ED (erectile dysfunction) 06/29/2012  . Essential hypertension 06/29/2012  . Barrett's esophagus 06/29/2012  . Hypercholesterolemia 06/29/2012  . Lower urinary tract symptoms (LUTS) 06/29/2012  . Prostate cancer ([facility]) 06/29/2012  . SUI (stress urinary incontinence), male 06/29/2012   Past Medical History:  Diagnosis Date  . Anxiety   .  Barrett's esophagus   . Cancer [facility])    prostate cancer-had surgery  . Chronic back pain   . COPD (chronic obstructive pulmonary disease) ([place])   . ETOH abuse    daily 2-3-vodka drinks  . GERD (gastroesophageal reflux disease)   . Hypercholesteremia   . Hypertension   . Sleep apnea    severe OSA uses CPAP nightly AHI 37.6    History reviewed. No pertinent family history.  Past Surgical History:  Procedure Laterality Date  . APPENDECTOMY    . CARDIAC CATHETERIZATION     care everywhere- August 2019  . COLONOSCOPY    . DRUG INDUCED ENDOSCOPY N/A 03/15/2019   Procedure: DRUG INDUCED ENDOSCOPY;  Surgeon: Jerrell Belfast, MD;  Location: [facility];  Service: ENT;  Laterality: N/A;  Drug induced sleep endoscopy  . EYE SURGERY Bilateral   . GANGLION CYST EXCISION Left    wrist  . IMPLANTATION OF HYPOGLOSSAL NERVE STIMULATOR  06/05/2019   IMPLANTATION OF HYPOGLOSSAL NERVE STIMULATOR on 06/05/2019  . IMPLANTATION OF HYPOGLOSSAL NERVE STIMULATOR Right 06/05/2019   Procedure: IMPLANTATION OF HYPOGLOSSAL NERVE STIMULATOR;  Surgeon: Jerrell Belfast, MD;  Location: [facility];  Service: ENT;  Laterality: Right;  right anterior lateral neck, right anterior chest, right lateral lower chest  . LASIK Right   . PROSTATECTOMY    . TONSILLECTOMY     Social History   Occupational History  . Not on file  Tobacco Use  . Smoking status: Former Smoker    Packs/day: 3.00    Quit date: 11/26/1993    Years since quitting: 26.4  . Smokeless tobacco: Never Used  Vaping Use  . Vaping Use: Never used  Substance and Sexual Activity  . Alcohol use: Yes    Comment: drinks 2-3 vodka drinks every night  . Drug use: Not Currently  . Sexual activity: Not on file

## 2020-06-02 ENCOUNTER — Ambulatory Visit: Payer: Self-pay

## 2020-06-02 ENCOUNTER — Ambulatory Visit: Payer: Medicare Other | Admitting: Physical Medicine and Rehabilitation

## 2020-06-02 ENCOUNTER — Encounter: Payer: Self-pay | Admitting: Physical Medicine and Rehabilitation

## 2020-06-02 ENCOUNTER — Other Ambulatory Visit: Payer: Self-pay

## 2020-06-02 VITALS — BP 177/96 | HR 68

## 2020-06-02 DIAGNOSIS — M47816 Spondylosis without myelopathy or radiculopathy, lumbar region: Secondary | ICD-10-CM

## 2020-06-02 MED ORDER — METHYLPREDNISOLONE ACETATE 80 MG/ML IJ SUSP
80.0000 mg | Freq: Once | INTRAMUSCULAR | Status: AC
  Administered 2020-06-02: 80 mg

## 2020-06-02 NOTE — Progress Notes (Signed)
Pt state Lower back pain. Pt state standing and walking makes the pain worse. Pt state he has to sit down to ease the pain. Numeric Pain Rating Scale and Functional Assessment Average Pain 3   In the last MONTH (on 0-10 scale) has pain interfered with the following?  1. General activity like being  able to carry out your everyday physical activities such as walking, climbing stairs, carrying groceries, or moving a chair?  Rating(9)   +Driver, -BT, -Dye Allergies.

## 2020-06-04 NOTE — Progress Notes (Signed)
Ricky Zavala - 79 y.o. male MRN [id number]  Date of birth: Jun 29, 1941  Office Visit Note: Visit Date: 06/02/2020 PCP: Egbert Garibaldi, PA-C Referred by: Egbert Garibaldi, PA-C  Subjective: No chief complaint on file.  HPI:  Ricky Zavala is a 79 y.o. male who comes in today for planned repeat radiofrequency ablation of the Bilateral L4-L5 L5-S1 Lumbar facet joints. This would be ablation of the corresponding medial branches and/or dorsal rami.  Patient has had double diagnostic blocks with more than 70% relief and recent similar effect total diagnostic blocks.  Subsequent ablation gave them more than 6 months of over 60% relief.  The last lumbar radiofrequency ablation was performed at the end of 2019 early 2020.  This was a bilateral L4-5 and L5-S1 facet joint ablation.  They have had chronic back pain for quite some time, more than 3 months, which has been an ongoing situation with recalcitrant axial back pain.  They have no radicular pain.  Their axial pain is worse with standing and ambulating and on exam today with facet loading.  They have had physical therapy as well as home exercise program.  The imaging noted in the chart below indicated facet pathology. Accordingly they meet all the criteria and qualification for for radiofrequency ablation and we are going to complete this today hopefully for more longer term relief as part of comprehensive management program.   ROS Otherwise per HPI.  Assessment & Plan: Visit Diagnoses:  1. Spondylosis without myelopathy or radiculopathy, lumbar region     Plan: No additional findings.   Meds & Orders:  Meds ordered this encounter  Medications  . methylPREDNISolone acetate (DEPO-MEDROL) injection 80 mg    Orders Placed This Encounter  Procedures  . Radiofrequency,Lumbar  . XR C-ARM NO REPORT    Follow-up: No follow-ups on file.   Procedures: No procedures performed  Lumbar Facet Joint Nerve Denervation  Patient: Ricky Zavala      Date of  Birth: 1941/09/16 MRN: [id number] PCP: Egbert Garibaldi, PA-C      Visit Date: 06/02/2020   Universal Protocol:    Date/Time: 09/16/216:04 AM  Consent Given By: the patient  Position: PRONE  Additional Comments: Vital signs were monitored before and after the procedure. Patient was prepped and draped in the usual sterile fashion. The correct patient, procedure, and site was verified.   Injection Procedure Details:  Procedure Site One Meds Administered:  Meds ordered this encounter  Medications  . methylPREDNISolone acetate (DEPO-MEDROL) injection 80 mg     Laterality: Bilateral  Location/Site:  L4-L5 L5-S1  Needle size: 18 G  Needle type: Radiofrequency cannula  Needle Placement: Along juncture of superior articular process and transverse pocess  Findings:  -Comments:  Procedure Details: For each desired target nerve, the corresponding transverse process (sacral ala for the L5 dorsal rami) was identified and the fluoroscope was positioned to square off the endplates of the corresponding vertebral body to achieve a true AP midline view.  The beam was then obliqued 15 to 20 degrees and caudally tilted 15 to 20 degrees to line up a trajectory along the target nerves. The skin over the target of the junction of superior articulating process and transverse process (sacral ala for the L5 dorsal rami) was infiltrated with 1ml of 1% Lidocaine without Epinephrine.  The 18 gauge 56mm active tip outer cannula was advanced in trajectory view to the target.  This procedure was repeated for each target nerve.  Then, for all levels, the  outer cannula placement was fine-tuned and the position was then confirmed with bi-planar imaging.    Test stimulation was done both at sensory and motor levels to ensure there was no radicular stimulation. The target tissues were then infiltrated with 1 ml of 1% Lidocaine without Epinephrine. Subsequently, a percutaneous neurotomy was carried out for 90  seconds at 80 degrees Celsius.  After the completion of the lesion, 1 ml of injectate was delivered. It was then repeated for each facet joint nerve mentioned above. Appropriate radiographs were obtained to verify the probe placement during the neurotomy.   Additional Comments:  The patient tolerated the procedure well Dressing: 2 x 2 sterile gauze and Band-Aid    Post-procedure details: Patient was observed during the procedure. Post-procedure instructions were reviewed.  Patient left the clinic in stable condition.       Clinical History: MRI LUMBAR SPINE WITHOUT CONTRAST    TECHNIQUE:  Multiplanar, multisequence MR imaging of the lumbar spine was  performed. No intravenous contrast was administered.    COMPARISON: None.    FINDINGS:  Segmentation: The lowest lumbar type non-rib-bearing vertebra is  labeled as L5.    Alignment: 7 mm of anterolisthesis at L5-S1 associated with chronic  bilateral pars defects.    4 mm degenerative retrolisthesis at L2-3 with 2 mm degenerative  retrolisthesis L3-4.    Vertebrae: Marrow heterogeneity is present. Although this can be  caused by marrow infiltrative processes, the most common causes  include anemia, smoking, obesity, or advancing age.    1.5 cm in diameter hemangioma in the T12 vertebral body.    Type 1 degenerative endplate findings along the left inferior  endplate of L4. Type 2 degenerative endplate findings at T0-Z6.    Conus medullaris and cauda equina: Conus extends to the L1 level.  Conus and cauda equina appear normal.    Paraspinal and other soft tissues: Fluid signal intensity lesions of  the left kidney including what is probably a larger lesion along the  lower pole. These may well be cysts better technically nonspecific  and only partially included in imaging.    No pathologic retroperitoneal adenopathy is observed. No appreciable  abdominal aortic aneurysm.    Disc levels:    L1-2: No  impingement. Mild disc bulge.    L2-3: No impingement. Diffuse disc bulge.    L3-4: Borderline bilateral subarticular lateral recess stenosis due  to disc bulge and facet arthropathy.    L4-5: Borderline right subarticular lateral recess stenosis due to  disc bulge.    L5-S1: Mild to moderate bilateral foraminal stenosis due to disc  uncovering and subluxation along with mild facet arthropathy. This  is slightly worse on the right compared to the left.    IMPRESSION:  1. 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  2. Borderline impingement at L3-4 and L4-5 due to spondylosis and  degenerative disc disease.  3. Marrow heterogeneity is present. Although this can be caused by  marrow infiltrative processes, the most common causes include  anemia, smoking, obesity, or advancing age.  4. We image the margin of several fluid signal intensity lesions in  the vicinity of the left kidney. These are probably cysts but only a  small minority of the volume of these lesions is included on today's  imaging.      Electronically Signed  By: Cindra Eves.D.  On: 05/02/2018 16:55     Objective:  VS:  HT:    WT:   BMI:     BP:(!) 177/96  HR:68bpm  TEMP: ( )  RESP:  Physical Exam Constitutional:      General: He is not in acute distress.    Appearance: Normal appearance. He is not ill-appearing.  HENT:     Head: Normocephalic and atraumatic.     Right Ear: External ear normal.     Left Ear: External ear normal.  Eyes:     Extraocular Movements: Extraocular movements intact.  Cardiovascular:     Rate and Rhythm: Normal rate.     Pulses: Normal pulses.  Abdominal:     General: There is no distension.     Palpations: Abdomen is soft.  Musculoskeletal:        General: No tenderness or signs of injury.     Right lower leg: No  edema.     Left lower leg: No edema.     Comments: Patient has good distal strength without clonus.Patient somewhat slow to rise from a seated position to full extension.  There is concordant low back pain with facet loading and lumbar spine extension rotation.  There are no definitive trigger points but the patient is somewhat tender across the lower back and PSIS.  There is no pain with hip rotation.   Skin:    Findings: No erythema or rash.  Neurological:     General: No focal deficit present.     Mental Status: He is alert and oriented to person, place, and time.     Sensory: No sensory deficit.     Motor: No weakness or abnormal muscle tone.     Coordination: Coordination normal.  Psychiatric:        Mood and Affect: Mood normal.        Behavior: Behavior normal.      Imaging: No results found.

## 2020-06-04 NOTE — Procedures (Signed)
Lumbar Facet Joint Nerve Denervation  Patient: Ricky Zavala      Date of Birth: Jan 08, 1941 MRN: [id number] PCP: Egbert Garibaldi, PA-C      Visit Date: 06/02/2020   Universal Protocol:    Date/Time: 09/16/216:04 AM  Consent Given By: the patient  Position: PRONE  Additional Comments: Vital signs were monitored before and after the procedure. Patient was prepped and draped in the usual sterile fashion. The correct patient, procedure, and site was verified.   Injection Procedure Details:  Procedure Site One Meds Administered:  Meds ordered this encounter  Medications  . methylPREDNISolone acetate (DEPO-MEDROL) injection 80 mg     Laterality: Bilateral  Location/Site:  L4-L5 L5-S1  Needle size: 18 G  Needle type: Radiofrequency cannula  Needle Placement: Along juncture of superior articular process and transverse pocess  Findings:  -Comments:  Procedure Details: For each desired target nerve, the corresponding transverse process (sacral ala for the L5 dorsal rami) was identified and the fluoroscope was positioned to square off the endplates of the corresponding vertebral body to achieve a true AP midline view.  The beam was then obliqued 15 to 20 degrees and caudally tilted 15 to 20 degrees to line up a trajectory along the target nerves. The skin over the target of the junction of superior articulating process and transverse process (sacral ala for the L5 dorsal rami) was infiltrated with 9ml of 1% Lidocaine without Epinephrine.  The 18 gauge 56mm active tip outer cannula was advanced in trajectory view to the target.  This procedure was repeated for each target nerve.  Then, for all levels, the outer cannula placement was fine-tuned and the position was then confirmed with bi-planar imaging.    Test stimulation was done both at sensory and motor levels to ensure there was no radicular stimulation. The target tissues were then infiltrated with 1 ml of 1% Lidocaine without  Epinephrine. Subsequently, a percutaneous neurotomy was carried out for 90 seconds at 80 degrees Celsius.  After the completion of the lesion, 1 ml of injectate was delivered. It was then repeated for each facet joint nerve mentioned above. Appropriate radiographs were obtained to verify the probe placement during the neurotomy.   Additional Comments:  The patient tolerated the procedure well Dressing: 2 x 2 sterile gauze and Band-Aid    Post-procedure details: Patient was observed during the procedure. Post-procedure instructions were reviewed.  Patient left the clinic in stable condition.

## 2020-06-15 ENCOUNTER — Telehealth: Payer: Self-pay | Admitting: Physical Medicine and Rehabilitation

## 2020-06-15 NOTE — Telephone Encounter (Signed)
Patient states that the RFA he had 2 weeks ago really helped for a few days but the pain is back. I advised that he needs to give the RFA at least 4 weeks. He will see how he feels in 2 more weeks and call us back.

## 2020-06-15 NOTE — Telephone Encounter (Signed)
Patient called requesting to schedule appt for back pains. Please give this patient a call to set appt.  Patient phone number is [phone].

## 2020-06-23 IMAGING — MR MR LUMBAR SPINE W/O CM
4 of 5 series · 18 of 48 positions shown · non-contrast
Comparison: None.

CLINICAL DATA: Low back pain and bilateral leg pain and weakness.
Bilateral hip pain.

EXAM:
MRI LUMBAR SPINE WITHOUT CONTRAST
TECHNIQUE: Multiplanar, multisequence MR imaging of the lumbar spine was
performed. No intravenous contrast was administered.

[Series 6: T2 · sagittal · 4.0mm · 0.78mm/px · 6 of 17 slices shown (1 of 2)]
[im 1/17]
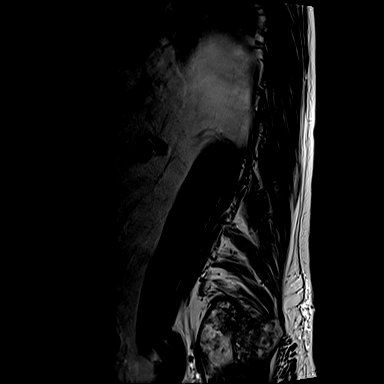
[im 4/17]
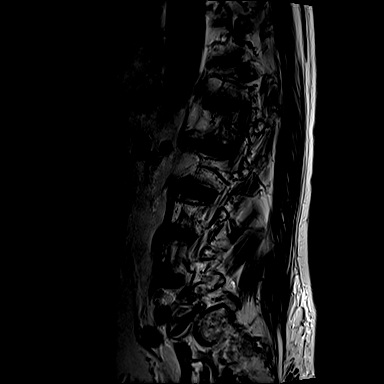
[im 7/17]
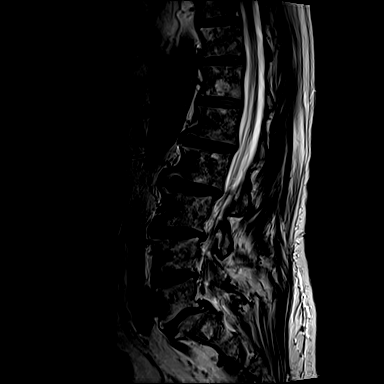
[im 10/17]
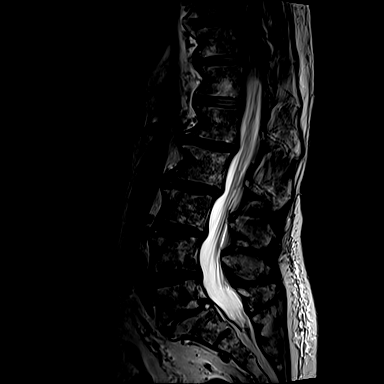
[im 13/17]
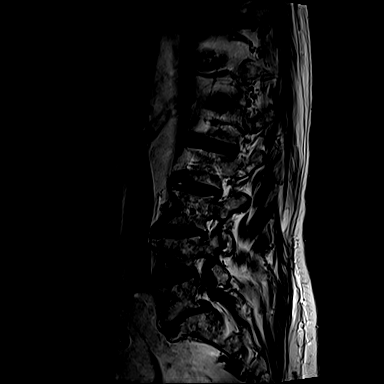
[im 17/17]
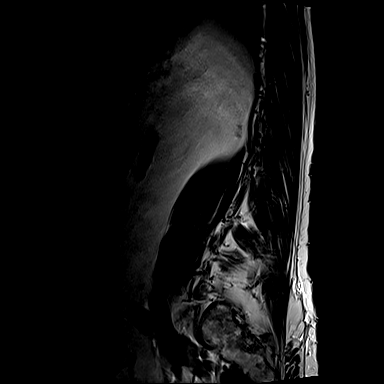

[Series 7: T1 · sagittal · 4.0mm · 0.73mm/px · 3 of 17 slices shown (1 of 2)]
[im 1/17]
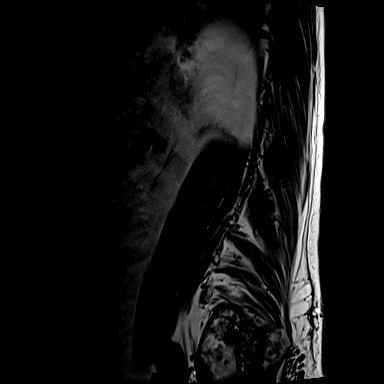
[im 9/17]
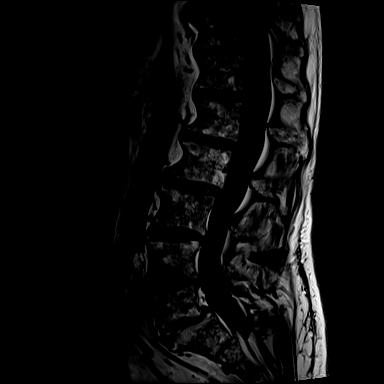
[im 17/17]
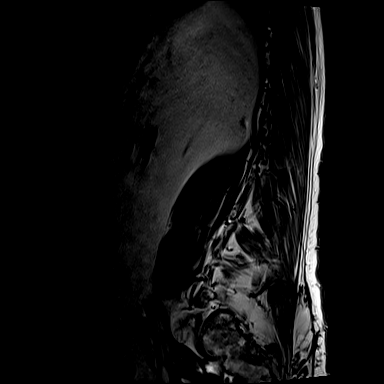

[Series 13: T2 · axial · 4.0mm · 0.28mm/px · z∈[-37,+168]mm · 6 of 50 slices shown (2 of 2)]
[im 4/50]
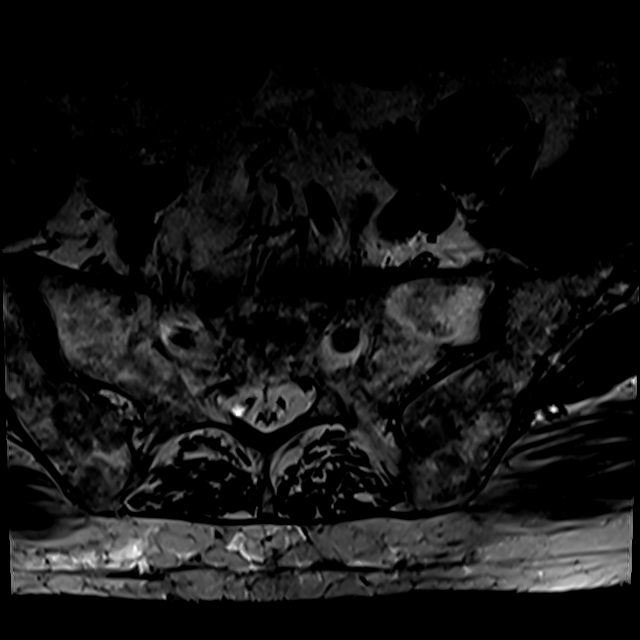
[im 7/50]
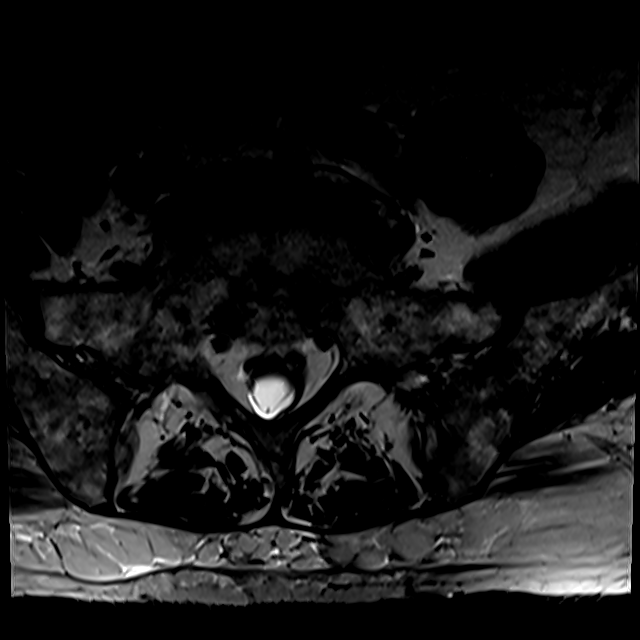
[im 10/50]
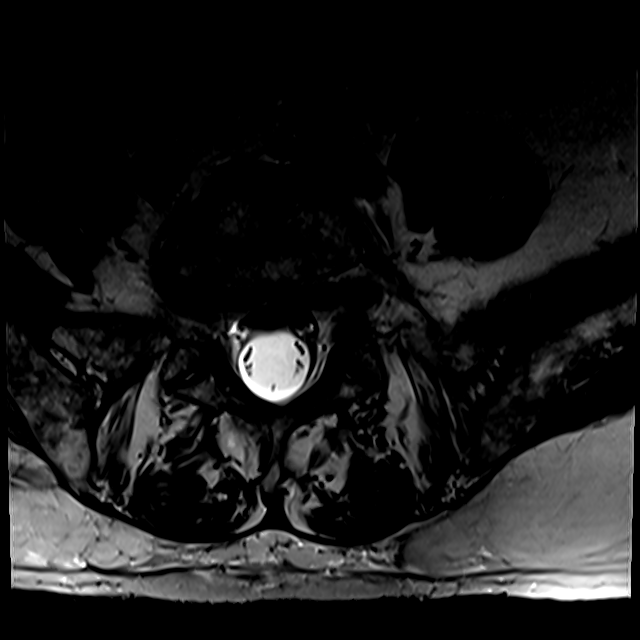
[im 17/50]
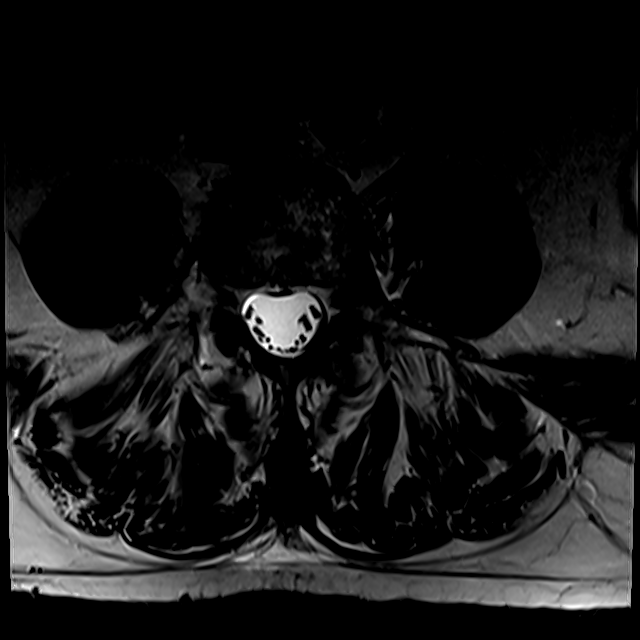
[im 27/50]
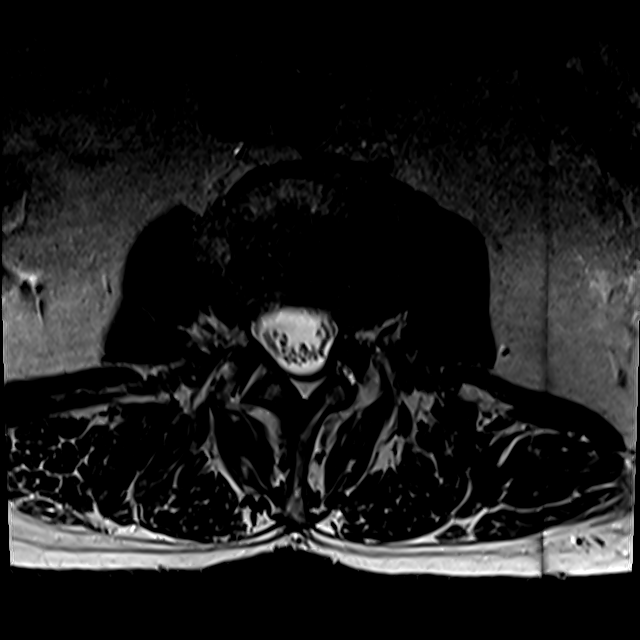
[im 43/50]
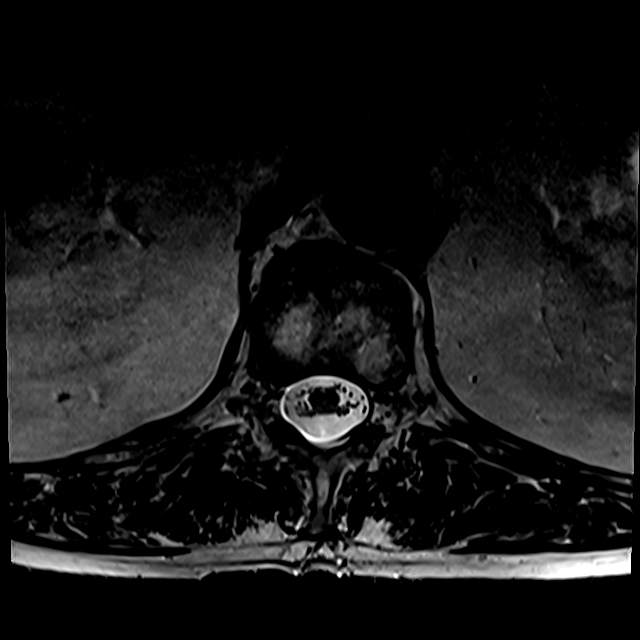

[Series 100: T1 · axial · 4.0mm · 0.28mm/px · z∈[-22,+168]mm · 3 of 50 slices shown (2 of 2)]
[im 7/50]
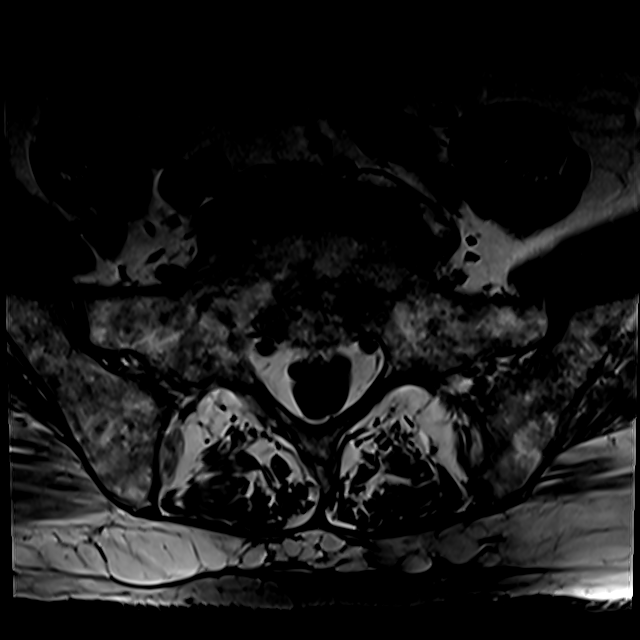
[im 27/50]
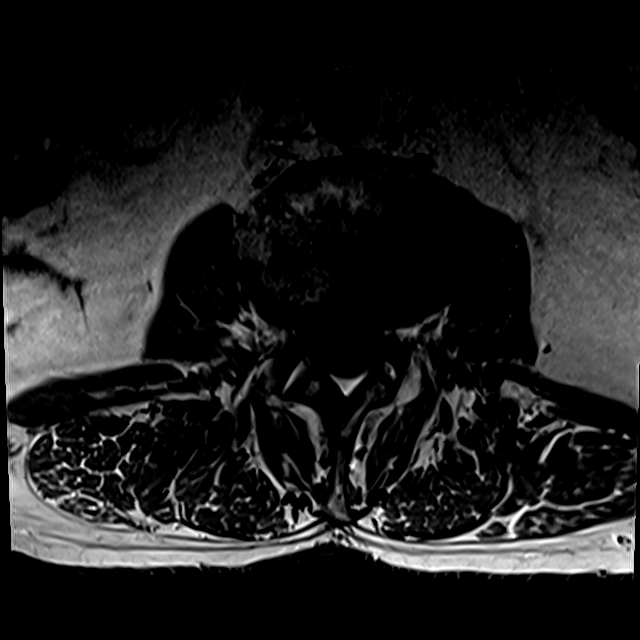
[im 43/50]
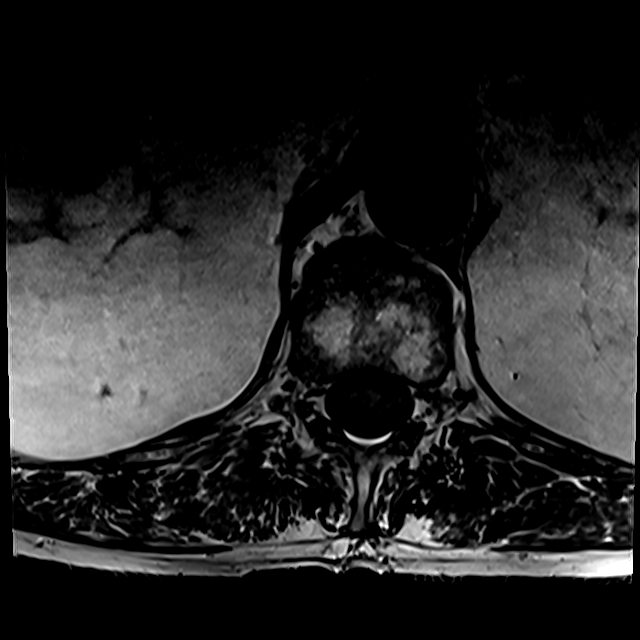

[18 of 48 positions shown; findings below may reference images not displayed]

FINDINGS: Segmentation: The lowest lumbar type non-rib-bearing vertebra is
labeled as L5.

Alignment: 7 mm of anterolisthesis at L5-S1 associated with chronic
bilateral pars defects.

4 mm degenerative retrolisthesis at L2-3 with 2 mm degenerative
retrolisthesis L3-4.

Vertebrae: Marrow heterogeneity is present. Although this can be
caused by marrow infiltrative processes, the most common causes
include anemia, smoking, obesity, or advancing age.

1.5 cm in diameter hemangioma in the T12 vertebral body.

Type 1 degenerative endplate findings along the left inferior
endplate of L4. Type 2 degenerative endplate findings at L5-S1.

Conus medullaris and cauda equina: Conus extends to the L1 level.
Conus and cauda equina appear normal.

Paraspinal and other soft tissues: Fluid signal intensity lesions of
the left kidney including what is probably a larger lesion along the
lower pole. These may well be cysts better technically nonspecific
and only partially included in imaging.

No pathologic retroperitoneal adenopathy is observed. No appreciable
abdominal aortic aneurysm.

Disc levels:

L1-2: No impingement.  Mild disc bulge.

L2-3: No impingement.  Diffuse disc bulge.

L3-4: Borderline bilateral subarticular lateral recess stenosis due
to disc bulge and facet arthropathy.

L4-5: Borderline right subarticular lateral recess stenosis due to
disc bulge.

L5-S1: Mild to moderate bilateral foraminal stenosis due to disc
uncovering and subluxation along with mild facet arthropathy. This
is slightly worse on the right compared to the left.
IMPRESSION: 1. Chronic bilateral pars defects at L5 with 7 mm of anterolisthesis
at this level, with disc uncovering and subluxation contributing to
mild to moderate bilateral foraminal stenosis.
2. Borderline impingement at L3-4 and L4-5 due to spondylosis and
degenerative disc disease.
3. Marrow heterogeneity is present. Although this can be caused by
marrow infiltrative processes, the most common causes include
anemia, smoking, obesity, or advancing age.
4. We image the margin of several fluid signal intensity lesions in
the vicinity of the left kidney. These are probably cysts but only a
small minority of the volume of these lesions is included on today's
imaging.

## 2020-07-14 ENCOUNTER — Ambulatory Visit: Payer: Medicare Other | Admitting: Orthopedic Surgery

## 2020-07-22 ENCOUNTER — Telehealth: Payer: Self-pay | Admitting: Physical Medicine and Rehabilitation

## 2020-07-22 ENCOUNTER — Other Ambulatory Visit: Payer: Self-pay

## 2020-07-22 ENCOUNTER — Ambulatory Visit: Payer: Medicare Other | Admitting: Podiatry

## 2020-07-22 DIAGNOSIS — Q667 Congenital pes cavus, unspecified foot: Secondary | ICD-10-CM | POA: Diagnosis not present

## 2020-07-22 DIAGNOSIS — M722 Plantar fascial fibromatosis: Secondary | ICD-10-CM | POA: Diagnosis not present

## 2020-07-22 NOTE — Telephone Encounter (Signed)
Called pt back and sch OV ROV back pain 11/17.

## 2020-07-22 NOTE — Telephone Encounter (Signed)
Patient called requesting a appt to be seen for his back. Please call patient at [phone].

## 2020-07-22 NOTE — Patient Instructions (Signed)
Follow up with Liliane Channel for orthotics  Follow up with Caryl Pina for EPAT

## 2020-07-23 ENCOUNTER — Encounter: Payer: Self-pay | Admitting: Podiatry

## 2020-07-23 NOTE — Progress Notes (Signed)
Subjective:  Patient ID: Ricky Zavala, male    DOB: [birth year],  MRN: [id number]  Chief Complaint  Patient presents with  . Plantar Fasciitis     np - plantar fasciitis    79 y.o. male presents with the above complaint.  Patient presents with a follow-up of right plantar fasciitis that has been going on for 20 to 25 years.  Patient had x-rays of the right foot done by Dr. Sharol Given.  He states that no injections have helped he has had many injections over the years.  He last saw Dr. Wardell Honour at [facility] but he did not like the sound of surgery at that time.  At this time he wants to discuss what are his all treatment options that are available.  He has not tried any orthotics he has not tried any EPAT therapy.  He denies any other acute complaints.   Review of Systems: Negative except as noted in the HPI. Denies N/V/F/Ch.  Past Medical History:  Diagnosis Date  . Anxiety   . Barrett's esophagus   . Cancer [facility])    prostate cancer-had surgery  . Chronic back pain   . COPD (chronic obstructive pulmonary disease) ([place])   . ETOH abuse    daily 2-3-vodka drinks  . GERD (gastroesophageal reflux disease)   . Hypercholesteremia   . Hypertension   . Sleep apnea    severe OSA uses CPAP nightly AHI 37.6    Current Outpatient Medications:  .  ALPRAZolam (XANAX) 0.5 MG tablet, Take 0.5 mg by mouth 2 (two) times daily. , Disp: , Rfl:  .  amLODipine (NORVASC) 2.5 MG tablet, Take 2.5 mg by mouth daily., Disp: , Rfl:  .  amLODipine (NORVASC) 5 MG tablet, Take 5 mg by mouth daily. , Disp: , Rfl:  .  ascorbic acid (VITAMIN C) 1000 MG tablet, Take by mouth., Disp: , Rfl:  .  cyanocobalamin 1000 MCG tablet, Take 1,000 mcg by mouth daily. , Disp: , Rfl:  .  ergocalciferol (VITAMIN D2) 50000 units capsule, Take 50,000 Units by mouth every Tuesday. , Disp: , Rfl:  .  folic acid (FOLVITE) 1 MG tablet, Take 1 mg by mouth daily. , Disp: , Rfl:  .  labetalol (NORMODYNE) 200 MG tablet, Take 600 mg by mouth  2 (two) times daily. , Disp: , Rfl:  .  latanoprost (XALATAN) 0.005 % ophthalmic solution, Place 1 drop into both eyes at bedtime. , Disp: , Rfl:  .  lisinopril-hydrochlorothiazide (PRINZIDE,ZESTORETIC) 20-25 MG tablet, Take 1 tablet by mouth daily. , Disp: , Rfl:  .  Multiple Vitamins-Minerals (PRESERVISION AREDS 2 PO), Take 1 tablet by mouth 2 (two) times daily. , Disp: , Rfl:  .  omeprazole (PRILOSEC) 40 MG capsule, Take 40 mg by mouth 2 (two) times daily. , Disp: , Rfl:  .  pravastatin (PRAVACHOL) 10 MG tablet, Take 10 mg by mouth daily., Disp: , Rfl:  .  spironolactone (ALDACTONE) 25 MG tablet, Take by mouth., Disp: , Rfl:  .  traMADol (ULTRAM) 50 MG tablet, Take 50 mg by mouth. , Disp: , Rfl:  .  Travoprost, BAK Free, (TRAVATAN) 0.004 % SOLN ophthalmic solution, 1 drop nightly., Disp: , Rfl:  .  TRELEGY ELLIPTA 100-62.5-25 MCG/INH AEPB, Take 1 puff by mouth daily., Disp: , Rfl:  .  triamcinolone cream (KENALOG) 0.1 %, SMARTSIG:Topical 1 to 2 Times Daily PRN, Disp: , Rfl:  .  zolpidem (AMBIEN) 10 MG tablet, Take 10 mg by mouth at  bedtime. , Disp: , Rfl:   Social History   Tobacco Use  Smoking Status Former Smoker  . Packs/day: 3.00  . Quit date: 11/26/1993  . Years since quitting: 26.6  Smokeless Tobacco Never Used    No Known Allergies Objective:  There were no vitals filed for this visit. There is no height or weight on file to calculate BMI. Constitutional Well developed. Well nourished.  Vascular Dorsalis pedis pulses palpable bilaterally. Posterior tibial pulses palpable bilaterally. Capillary refill normal to all digits.  No cyanosis or clubbing noted. Pedal hair growth normal.  Neurologic Normal speech. Oriented to person, place, and time. Epicritic sensation to light touch grossly present bilaterally.  Dermatologic Nails well groomed and normal in appearance. No open wounds. No skin lesions.  Orthopedic: Normal joint ROM without pain or crepitus bilaterally. No  visible deformities. Tender to palpation at the central band of the central part of the plantar fascia No pain with calcaneal squeeze right. Ankle ROM diminished range of motion right. Silfverskiold Test: positive right.   Radiographs: Taken and reviewed. No acute fractures or dislocations. No evidence of stress fracture.  Plantar heel spur present. Posterior heel spur absent.   Assessment:   1. Plantar fasciitis of right foot   2. Pes cavus    Plan:  Patient was evaluated and treated and all questions answered.  Plantar Fasciitis, right - XR reviewed as above.  - Educated on icing and stretching. Instructions given.  -No further injection delivered to the plantar fascia as below. - DME: None - Pharmacologic management: None -Given that patient has failed many conservative treatment options including bracing padding cam boot immobilization multiple injections at this time I believe he will benefit from EPAT therapy.  He will follow up with Caryl Pina to schedule for EPAT therapy.  Pes cavus semiflexible -I explained to the patient the etiology of pes cavus foot structure and various treatment options were discussed.  I discussed with him that given the foot structure that he has is putting excessive stress on the plantar fascia especially in the central band of the midfoot part.  I believe he will benefit from custom-made orthotics to help support the arch of the foot control the hindfoot motion.  Patient states understanding he would like to obtain orthotics. -He will be scheduled to see [facility] for custom-made orthotics    No follow-ups on file.

## 2020-07-28 ENCOUNTER — Ambulatory Visit (INDEPENDENT_AMBULATORY_CARE_PROVIDER_SITE_OTHER): Payer: Medicare Other | Admitting: Orthotics

## 2020-07-28 ENCOUNTER — Other Ambulatory Visit: Payer: Self-pay

## 2020-07-28 DIAGNOSIS — Q667 Congenital pes cavus, unspecified foot: Secondary | ICD-10-CM

## 2020-07-28 DIAGNOSIS — M722 Plantar fascial fibromatosis: Secondary | ICD-10-CM

## 2020-07-28 NOTE — Progress Notes (Signed)

## 2020-08-05 ENCOUNTER — Telehealth: Payer: Self-pay

## 2020-08-05 ENCOUNTER — Ambulatory Visit: Payer: Medicare Other | Admitting: Physical Medicine and Rehabilitation

## 2020-08-05 NOTE — Telephone Encounter (Signed)
Called pt and lvm #1 

## 2020-08-05 NOTE — Telephone Encounter (Signed)
Patient called he had a appointment this morning but couldn't make it he would like a call back to reschedule his appointment. CB:[phone]

## 2020-08-07 ENCOUNTER — Telehealth: Payer: Self-pay

## 2020-08-07 NOTE — Telephone Encounter (Signed)
Left message #2

## 2020-08-07 NOTE — Telephone Encounter (Signed)
Rescheduled

## 2020-08-07 NOTE — Telephone Encounter (Signed)
Patient called back returning missed call

## 2020-08-10 NOTE — Telephone Encounter (Signed)
Rescheduled

## 2020-08-25 ENCOUNTER — Ambulatory Visit: Payer: Medicare Other | Admitting: Orthotics

## 2020-08-25 ENCOUNTER — Other Ambulatory Visit: Payer: Self-pay

## 2020-08-25 DIAGNOSIS — Q667 Congenital pes cavus, unspecified foot: Secondary | ICD-10-CM

## 2020-08-25 DIAGNOSIS — M722 Plantar fascial fibromatosis: Secondary | ICD-10-CM

## 2020-08-25 NOTE — Progress Notes (Signed)
Patient came in today to pick up custom made foot orthotics.  The goals were accomplished and the patient reported no dissatisfaction with said orthotics.  Patient was advised of breakin period and how to report any issues. 

## 2020-08-27 ENCOUNTER — Telehealth: Payer: Self-pay | Admitting: Podiatry

## 2020-08-27 NOTE — Telephone Encounter (Signed)
Pt called asking to speak to [facility], he picked up orthotics a couple of days ago and the right foot that has plantar fascitis is hurting him more.

## 2020-08-28 NOTE — Telephone Encounter (Signed)
Dawn, I tried to call, no answer.  Please call and schedule an appointment for him to come in and see me.

## 2020-09-01 ENCOUNTER — Ambulatory Visit: Payer: Medicare Other | Admitting: Orthotics

## 2020-09-01 ENCOUNTER — Other Ambulatory Visit: Payer: Self-pay

## 2020-09-01 DIAGNOSIS — Q667 Congenital pes cavus, unspecified foot: Secondary | ICD-10-CM

## 2020-09-01 DIAGNOSIS — M722 Plantar fascial fibromatosis: Secondary | ICD-10-CM

## 2020-09-01 NOTE — Progress Notes (Signed)
Excavated material from arch to make more flexible.

## 2020-09-04 ENCOUNTER — Ambulatory Visit (INDEPENDENT_AMBULATORY_CARE_PROVIDER_SITE_OTHER): Payer: Medicare Other | Admitting: *Deleted

## 2020-09-04 ENCOUNTER — Other Ambulatory Visit: Payer: Self-pay

## 2020-09-04 DIAGNOSIS — B351 Tinea unguium: Secondary | ICD-10-CM

## 2020-09-04 DIAGNOSIS — M722 Plantar fascial fibromatosis: Secondary | ICD-10-CM

## 2020-09-04 NOTE — Patient Instructions (Signed)

## 2020-09-04 NOTE — Progress Notes (Signed)
Patient presents for the 1st EPAT treatment today with complaint of plantar heel pain right. Diagnosed with plantar fasciitis by Dr. Posey Pronto. This has been ongoing for several months. The patient has tried ice, stretching, NSAIDS and supportive shoe gear with no long term relief.   Most of the pain is located medial/plantar heel and medial foot over prominent navicular.  He is wearing his orthotics and says they feel more comfortable now.  ESWT administered and tolerated well.Treatment settings initiated at:   Energy: 15  Ended treatment session today with 3000 shocks at the following settings:   Energy: 15  Frequency: 6.0  Joules: 14.72  Focused treatment over the entire medial foot and plantar heel   Reviewed post EPAT instructions. Advised to avoid ice and NSAIDs throughout the treatment process and to utilize boot or supportive shoes for at least the next 3 days.  Follow up for 2nd treatment in 1-2 weeks.

## 2020-09-16 ENCOUNTER — Ambulatory Visit (INDEPENDENT_AMBULATORY_CARE_PROVIDER_SITE_OTHER): Payer: Medicare Other

## 2020-09-16 ENCOUNTER — Other Ambulatory Visit: Payer: Self-pay

## 2020-09-16 DIAGNOSIS — M722 Plantar fascial fibromatosis: Secondary | ICD-10-CM

## 2020-09-16 DIAGNOSIS — B351 Tinea unguium: Secondary | ICD-10-CM

## 2020-09-16 NOTE — Progress Notes (Signed)
Patient presents for the 2nd EPAT treatment today with complaint of plantar heel pain right. Diagnosed with plantar fasciitis by Dr. Allena Katz. This has been ongoing for several months. The patient has tried ice, stretching, NSAIDS and supportive shoe gear with no long term relief.   Most of the pain is located medial/plantar heel and medial foot over prominent navicular.  He is wearing his orthotics and says they feel more comfortable now.  ESWT administered and tolerated well.Treatment settings initiated at:   Energy: 20  Ended treatment session today with 3000 shocks at the following settings:   Energy: 20  Frequency: 5.0  Joules: 19.62  Focused treatment over the entire medial foot and plantar heel   Reviewed post EPAT instructions. Advised to avoid ice and NSAIDs throughout the treatment process and to utilize boot or supportive shoes for at least the next 3 days.  Follow up for 3rd treatment in 1-2 weeks.

## 2020-09-23 ENCOUNTER — Encounter: Payer: Self-pay | Admitting: Physical Medicine and Rehabilitation

## 2020-09-23 ENCOUNTER — Other Ambulatory Visit: Payer: Self-pay

## 2020-09-23 ENCOUNTER — Ambulatory Visit: Payer: Medicare Other | Admitting: Physical Medicine and Rehabilitation

## 2020-09-23 ENCOUNTER — Ambulatory Visit: Payer: Self-pay

## 2020-09-23 VITALS — BP 161/88 | HR 62

## 2020-09-23 DIAGNOSIS — M4310 Spondylolisthesis, site unspecified: Secondary | ICD-10-CM

## 2020-09-23 DIAGNOSIS — M5416 Radiculopathy, lumbar region: Secondary | ICD-10-CM

## 2020-09-23 DIAGNOSIS — G8929 Other chronic pain: Secondary | ICD-10-CM

## 2020-09-23 DIAGNOSIS — M545 Low back pain, unspecified: Secondary | ICD-10-CM

## 2020-09-23 DIAGNOSIS — M4316 Spondylolisthesis, lumbar region: Secondary | ICD-10-CM

## 2020-09-23 DIAGNOSIS — R269 Unspecified abnormalities of gait and mobility: Secondary | ICD-10-CM

## 2020-09-23 DIAGNOSIS — M4300 Spondylolysis, site unspecified: Secondary | ICD-10-CM

## 2020-09-23 MED ORDER — METHYLPREDNISOLONE ACETATE 80 MG/ML IJ SUSP
80.0000 mg | Freq: Once | INTRAMUSCULAR | Status: AC
  Administered 2020-09-23: 80 mg

## 2020-09-23 NOTE — Progress Notes (Signed)
No relief following RFA.  Continued low back pain. Pain is equal on both sides. Worse with standing. Numeric Pain Rating Scale and Functional Assessment Average Pain 8   In the last MONTH (on 0-10 scale) has pain interfered with the following?  1. General activity like being  able to carry out your everyday physical activities such as walking, climbing stairs, carrying groceries, or moving a chair?  Rating(9)   +Driver, -BT- patient has new prescription for Plavix but has not started, -Dye Allergies.

## 2020-09-25 ENCOUNTER — Other Ambulatory Visit: Payer: Medicare Other

## 2020-10-01 ENCOUNTER — Ambulatory Visit: Payer: Medicare Other | Admitting: Orthotics

## 2020-10-01 ENCOUNTER — Other Ambulatory Visit: Payer: Self-pay

## 2020-10-01 DIAGNOSIS — Q667 Congenital pes cavus, unspecified foot: Secondary | ICD-10-CM

## 2020-10-01 DIAGNOSIS — M722 Plantar fascial fibromatosis: Secondary | ICD-10-CM

## 2020-10-01 NOTE — Progress Notes (Signed)
Patient came back in today with the complaint that the f/o hurt his feet and he wanted a refund; it was explained to him that I couldn't give him a refund as they are custom made devices.    I also told him that I would be glad to adjust them, even to the point of having them remade from either same or new cast.  He was not agreeable to that and left and said, "wait until you see your reviews on the web".   I advise Keely.   He did sign a Medicare ABN.

## 2020-10-07 ENCOUNTER — Telehealth: Payer: Self-pay | Admitting: Physical Medicine and Rehabilitation

## 2020-10-07 NOTE — Telephone Encounter (Signed)
Patient reports that he has had very little pain since his injection on 1/5, but his pain returned this morning. Please advise.

## 2020-10-07 NOTE — Telephone Encounter (Signed)
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

## 2020-10-07 NOTE — Telephone Encounter (Signed)
Patient called requesting a call back from [place]. Patient states Dr. Ernestina Patches asked for him to call with back injection condition updates. Please call patient at [phone].

## 2020-10-08 NOTE — Telephone Encounter (Signed)
Is auth needed for repeat bilateral L5 TF? Scheduled for 1/26 with driver.

## 2020-10-09 NOTE — Telephone Encounter (Signed)
Pt not Req Auth# 

## 2020-10-14 ENCOUNTER — Ambulatory Visit: Payer: Self-pay

## 2020-10-14 ENCOUNTER — Ambulatory Visit: Payer: Medicare Other | Admitting: Physical Medicine and Rehabilitation

## 2020-10-14 ENCOUNTER — Encounter: Payer: Self-pay | Admitting: Physical Medicine and Rehabilitation

## 2020-10-14 ENCOUNTER — Other Ambulatory Visit: Payer: Self-pay

## 2020-10-14 VITALS — BP 120/73 | HR 73

## 2020-10-14 DIAGNOSIS — M5416 Radiculopathy, lumbar region: Secondary | ICD-10-CM | POA: Diagnosis not present

## 2020-10-14 MED ORDER — METHYLPREDNISOLONE ACETATE 80 MG/ML IJ SUSP
80.0000 mg | Freq: Once | INTRAMUSCULAR | Status: AC
  Administered 2020-10-14: 80 mg

## 2020-10-14 NOTE — Progress Notes (Signed)
Pt state lower back pain. Pt state walking and bending makes the pain worse. Pt state he sits to rest and take over the counter painmeds to help ease the pain. Pt has hx of inj on 09/23/20 pt state it help for about two weeks.  Numeric Pain Rating Scale and Functional Assessment Average Pain 4   In the last MONTH (on 0-10 scale) has pain interfered with the following?  1. General activity like being  able to carry out your everyday physical activities such as walking, climbing stairs, carrying groceries, or moving a chair?  Rating(9)   +Driver, +BT, -Dye Allergies.

## 2020-10-14 NOTE — Patient Instructions (Signed)

## 2020-10-26 ENCOUNTER — Other Ambulatory Visit: Payer: Self-pay

## 2020-10-26 ENCOUNTER — Ambulatory Visit (INDEPENDENT_AMBULATORY_CARE_PROVIDER_SITE_OTHER): Payer: Medicare Other | Admitting: *Deleted

## 2020-10-26 DIAGNOSIS — M722 Plantar fascial fibromatosis: Secondary | ICD-10-CM

## 2020-10-26 DIAGNOSIS — B351 Tinea unguium: Secondary | ICD-10-CM

## 2020-10-26 NOTE — Progress Notes (Signed)
Patient presents for the 3rd EPAT treatment today with complaint of plantar heel pain right. Diagnosed with plantar fasciitis by Dr. Posey Pronto. This has been ongoing for several months. The patient has tried ice, stretching, NSAIDS and supportive shoe gear with no long term relief.   Most of the pain today is located throughout the medial foot, over prominent navicular.  He says he has seen NO improvement at all.  ESWT administered and tolerated well.Treatment settings initiated at:   Energy: 25  Ended treatment session today with 3000 shocks at the following settings:   Energy: 25  Frequency: 4.0  Joules: 24.52  Focused treatment over the entire medial foot   Reviewed post EPAT instructions. Advised to avoid ice and NSAIDs throughout the treatment process and to utilize boot or supportive shoes for at least the next 3 days.  Patient would like to follow up with Dr. Posey Pronto and see what his other options are. He will follow up in 2 weeks.

## 2020-11-03 ENCOUNTER — Encounter: Payer: Self-pay | Admitting: Physical Medicine and Rehabilitation

## 2020-11-03 NOTE — Procedures (Signed)
Lumbosacral Transforaminal Epidural Steroid Injection - Sub-Pedicular Approach with Fluoroscopic Guidance  Patient: Ricky Zavala      Date of Birth: 1941/03/07 MRN: [id number] PCP: Egbert Garibaldi, PA-C      Visit Date: 09/23/2020   Universal Protocol:    Date/Time: 09/23/2020  Consent Given By: the patient  Position: PRONE  Additional Comments: Vital signs were monitored before and after the procedure. Patient was prepped and draped in the usual sterile fashion. The correct patient, procedure, and site was verified.   Injection Procedure Details:   Procedure diagnoses: Lumbar radiculopathy [M54.16]    Meds Administered:  Meds ordered this encounter  Medications  . methylPREDNISolone acetate (DEPO-MEDROL) injection 80 mg    Laterality: Bilateral  Location/Site:  L5-S1  Needle:5.0 in., 22 ga.  Short bevel or Quincke spinal needle  Needle Placement: Transforaminal  Findings:    -Comments: Excellent flow of contrast along the nerve, nerve root and into the epidural space.  Procedure Details: After squaring off the end-plates to get a true AP view, the C-arm was positioned so that an oblique view of the foramen as noted above was visualized. The target area is just inferior to the "nose of the scotty dog" or sub pedicular. The soft tissues overlying this structure were infiltrated with 2-3 ml. of 1% Lidocaine without Epinephrine.  The spinal needle was inserted toward the target using a "trajectory" view along the fluoroscope beam.  Under AP and lateral visualization, the needle was advanced so it did not puncture dura and was located close the 6 O'Clock position of the pedical in AP tracterory. Biplanar projections were used to confirm position. Aspiration was confirmed to be negative for CSF and/or blood. A 1-2 ml. volume of Isovue-250 was injected and flow of contrast was noted at each level. Radiographs were obtained for documentation purposes.   After attaining the  desired flow of contrast documented above, a 0.5 to 1.0 ml test dose of 0.25% Marcaine was injected into each respective transforaminal space.  The patient was observed for 90 seconds post injection.  After no sensory deficits were reported, and normal lower extremity motor function was noted,   the above injectate was administered so that equal amounts of the injectate were placed at each foramen (level) into the transforaminal epidural space.   Additional Comments:  The patient tolerated the procedure well Dressing: 2 x 2 sterile gauze and Band-Aid    Post-procedure details: Patient was observed during the procedure. Post-procedure instructions were reviewed.  Patient left the clinic in stable condition.

## 2020-11-03 NOTE — Progress Notes (Signed)
Ricky Zavala - 80 y.o. male MRN [id number]  Date of birth: Feb 05, 1941  Office Visit Note: Visit Date: 09/23/2020 PCP: Egbert Garibaldi, PA-C Referred by: Egbert Garibaldi, PA-C  Subjective: Chief Complaint  Patient presents with  . Lower Back - Pain   HPI: Ricky Zavala is a 80 y.o. male who comes in today For evaluation and management 5 weeks status post radiofrequency ablation of the lower lumbar facet joints.  This was a repeat ablation with prior ablation helped him quite a bit.  Unfortunately feels like he is not gotten really much relief from the ablation.  We did complete diagnostic medial branch blocks in a double block paradigm with pain diary and he reported good relief during that temporary phase of the injections.  He has been a longtime patient over the years getting good relief with intermittent facet joint blocks and then with prior radiofrequency ablation but now with increased back pain not relieved with the ablation.  He is present today with his wife who provides some of the history.  He appears fairly comfortable in the office but rates his pain as an 8 out of 10.  It is worse with standing and ambulating.  He reports that the pain is equal on both sides with some referral in the hips but nothing really down the legs.  He does note some instability and balance issues.  This is followed by Dr. Cammy Brochure at [facility] neurology.  Last MRI of the lumbar spine was in 2019 and he is now status post hypoglossal nerve stimulator and cannot have MRI.  Fortunately that MRI which is not that all did not show any central stenosis and likely at his age he would not develop central stenosis.  There is a pars defect of L5 on S1 with some lateral recess narrowing but no high-grade stenosis.  Review of Systems  Musculoskeletal: Positive for back pain and joint pain.  Neurological:       Balance difficulties  All other systems reviewed and are negative.  Otherwise per HPI.  Assessment &  Plan: Visit Diagnoses:    ICD-10-CM   1. Lumbar radiculopathy  M54.16 XR C-ARM NO REPORT    Epidural Steroid injection    methylPREDNISolone acetate (DEPO-MEDROL) injection 80 mg  2. Pars defect with spondylolisthesis  M43.00 XR C-ARM NO REPORT   M43.10 Epidural Steroid injection    methylPREDNISolone acetate (DEPO-MEDROL) injection 80 mg  3. Spondylolisthesis of lumbar region  M43.16   4. Chronic bilateral low back pain without sciatica  M54.50    G89.29   5. Gait abnormality  R26.9      Plan: Findings:  1.  Increase in low back pain some referral in the hips even though he has completed recent radiofrequency ablation.  Prior ablations were helpful.  Review of imaging from the ablation shows well-placed cannulas.  Left side showed 1 level medial branch that was a little bit different in placement but still adequate.  Again he is not having just pain on 1 side so not sure that that is an issue.  He does have bilateral pars defects with listhesis.  We have never really completed injection at that level because he is never really had radicular type complaints.  We decided today together based on the severity of his pain to complete bilateral L5 transforaminal epidural steroid injections diagnostically.  If he is not much better from that would look at CT scan of the lumbar spine.  He will  continue with current medications.  We talked about activity modification and possibly regrouping with physical therapist.  2.  Balance difficulties and gait problems are essentially neurologic issues are just no evidence on prior MRI imaging of the lumbar spine at least that would go along with his difficulties with balance.  He will follow up with neurology at [facility]:  Meds ordered this encounter  Medications  . methylPREDNISolone acetate (DEPO-MEDROL) injection 80 mg    Orders Placed This Encounter  Procedures  . XR C-ARM NO REPORT  . Epidural Steroid injection    Follow-up:  Return if symptoms worsen or fail to improve.   Procedures: No procedures performed  Lumbosacral Transforaminal Epidural Steroid Injection - Sub-Pedicular Approach with Fluoroscopic Guidance  Patient: Ricky Zavala      Date of Birth: October 17, 1940 MRN: [id number] PCP: Egbert Garibaldi, PA-C      Visit Date: 09/23/2020   Universal Protocol:    Date/Time: 09/23/2020  Consent Given By: the patient  Position: PRONE  Additional Comments: Vital signs were monitored before and after the procedure. Patient was prepped and draped in the usual sterile fashion. The correct patient, procedure, and site was verified.   Injection Procedure Details:   Procedure diagnoses: Lumbar radiculopathy [M54.16]    Meds Administered:  Meds ordered this encounter  Medications  . methylPREDNISolone acetate (DEPO-MEDROL) injection 80 mg    Laterality: Bilateral  Location/Site:  L5-S1  Needle:5.0 in., 22 ga.  Short bevel or Quincke spinal needle  Needle Placement: Transforaminal  Findings:    -Comments: Excellent flow of contrast along the nerve, nerve root and into the epidural space.  Procedure Details: After squaring off the end-plates to get a true AP view, the C-arm was positioned so that an oblique view of the foramen as noted above was visualized. The target area is just inferior to the "nose of the scotty dog" or sub pedicular. The soft tissues overlying this structure were infiltrated with 2-3 ml. of 1% Lidocaine without Epinephrine.  The spinal needle was inserted toward the target using a "trajectory" view along the fluoroscope beam.  Under AP and lateral visualization, the needle was advanced so it did not puncture dura and was located close the 6 O'Clock position of the pedical in AP tracterory. Biplanar projections were used to confirm position. Aspiration was confirmed to be negative for CSF and/or blood. A 1-2 ml. volume of Isovue-250 was injected and flow of contrast was noted at each  level. Radiographs were obtained for documentation purposes.   After attaining the desired flow of contrast documented above, a 0.5 to 1.0 ml test dose of 0.25% Marcaine was injected into each respective transforaminal space.  The patient was observed for 90 seconds post injection.  After no sensory deficits were reported, and normal lower extremity motor function was noted,   the above injectate was administered so that equal amounts of the injectate were placed at each foramen (level) into the transforaminal epidural space.   Additional Comments:  The patient tolerated the procedure well Dressing: 2 x 2 sterile gauze and Band-Aid    Post-procedure details: Patient was observed during the procedure. Post-procedure instructions were reviewed.  Patient left the clinic in stable condition.      Clinical History: MRI LUMBAR SPINE WITHOUT CONTRAST    TECHNIQUE:  Multiplanar, multisequence MR imaging of the lumbar spine was  performed. No intravenous contrast was administered.    COMPARISON: None.    FINDINGS:  Segmentation: The lowest lumbar type non-rib-bearing vertebra is  labeled as L5.    Alignment: 7 mm of anterolisthesis at L5-S1 associated with chronic  bilateral pars defects.    4 mm degenerative retrolisthesis at L2-3 with 2 mm degenerative  retrolisthesis L3-4.    Vertebrae: Marrow heterogeneity is present. Although this can be  caused by marrow infiltrative processes, the most common causes  include anemia, smoking, obesity, or advancing age.    1.5 cm in diameter hemangioma in the T12 vertebral body.    Type 1 degenerative endplate findings along the left inferior  endplate of L4. Type 2 degenerative endplate findings at V5-I4.    Conus medullaris and cauda equina: Conus extends to the L1 level.  Conus and cauda equina appear normal.    Paraspinal and other soft tissues: Fluid signal intensity lesions of  the left kidney including what is probably  a larger lesion along the  lower pole. These may well be cysts better technically nonspecific  and only partially included in imaging.    No pathologic retroperitoneal adenopathy is observed. No appreciable  abdominal aortic aneurysm.    Disc levels:    L1-2: No impingement. Mild disc bulge.    L2-3: No impingement. Diffuse disc bulge.    L3-4: Borderline bilateral subarticular lateral recess stenosis due  to disc bulge and facet arthropathy.    L4-5: Borderline right subarticular lateral recess stenosis due to  disc bulge.    L5-S1: Mild to moderate bilateral foraminal stenosis due to disc  uncovering and subluxation along with mild facet arthropathy. This  is slightly worse on the right compared to the left.    IMPRESSION:  1. 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  2. Borderline impingement at L3-4 and L4-5 due to spondylosis and  degenerative disc disease.  3. Marrow heterogeneity is present. Although this can be caused by  marrow infiltrative processes, the most common causes include  anemia, smoking, obesity, or advancing age.  4. We image the margin of several fluid signal intensity lesions in  the vicinity of the left kidney. These are probably cysts but only a  small minority of the volume of these lesions is included on today's  imaging.      Electronically Signed  By: Van Clines M.D.  On: 05/02/2018 16:55   He reports that he quit smoking about 26 years ago. He smoked 3.00 packs per day. He has never used smokeless tobacco. No results for input(s): HGBA1C, LABURIC in the last 8760 hours.  Objective:  VS:  HT:    WT:   BMI:     BP:(!) 161/88  HR:62bpm  TEMP: ( )  RESP:  Physical Exam Vitals and nursing note reviewed.  Constitutional:      General: He is not in acute distress.    Appearance: He  is well-developed.  HENT:     Head: Normocephalic and atraumatic.  Eyes:     Conjunctiva/sclera: Conjunctivae normal.     Pupils: Pupils are equal, round, and reactive to light.  Neck:     Trachea: No tracheal deviation.  Cardiovascular:     Rate and Rhythm: Normal rate and regular rhythm.     Pulses: Normal pulses.  Pulmonary:     Effort: Pulmonary effort is normal.     Breath sounds: Normal breath sounds.  Abdominal:  General: There is no distension.     Palpations: Abdomen is soft.     Tenderness: There is no guarding or rebound.  Musculoskeletal:        General: Tenderness present. No deformity.     Cervical back: Normal range of motion and neck supple.     Right lower leg: No edema.     Left lower leg: No edema.     Comments: Patient ambulates with some wide-based gait with forward flexed spine.  He has pain with extension and facet loading of the lumbar spine.  He has tenderness across the lower lumbar area in the paraspinal musculature.  He has some pain over the PSIS but no pain over the greater trochanters.  No pain with hip rotation.  He does have good distal strength.  Skin:    General: Skin is warm and dry.     Findings: No erythema or rash.  Neurological:     General: No focal deficit present.     Mental Status: He is alert and oriented to person, place, and time.     Cranial Nerves: No cranial nerve deficit.     Sensory: No sensory deficit.     Motor: No weakness or abnormal muscle tone.     Coordination: Coordination normal.     Gait: Gait abnormal.  Psychiatric:        Mood and Affect: Mood normal.        Behavior: Behavior normal.        Thought Content: Thought content normal.     Ortho Exam  Imaging: No results found.  Past Medical/Family/Surgical/Social History: Medications & Allergies reviewed per EMR, new medications updated. Patient Active Problem List   Diagnosis Date Noted  . Coronary artery disease involving native coronary artery of  native heart without angina pectoris 02/11/2020  . Obstructive sleep apnea 03/15/2019  . Cervical spondylosis without myelopathy 08/10/2018  . Spondylosis without myelopathy or radiculopathy, lumbar region 08/10/2018  . 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 DOE (dyspnea on exertion) 05/07/2018  . Chronic bilateral low back pain without sciatica 04/18/2018  . Abnormal EKG 01/29/2018  . Chest pain 01/29/2018  . Hyponatremia 01/29/2018  . Left ventricular hypertrophy 01/29/2018  . ED (erectile dysfunction) 06/29/2012  . Essential hypertension 06/29/2012  . Barrett's esophagus 06/29/2012  . Hypercholesterolemia 06/29/2012  . Lower urinary tract symptoms (LUTS) 06/29/2012  . Prostate cancer ([place]) 06/29/2012  . SUI (stress urinary incontinence), male 06/29/2012   Past Medical History:  Diagnosis Date  . Anxiety   . Barrett's esophagus   . Cancer [facility])    prostate cancer-had surgery  . Chronic back pain   . COPD (chronic obstructive pulmonary disease) ([place])   . ETOH abuse    daily 2-3-vodka drinks  . GERD (gastroesophageal reflux disease)   . Hypercholesteremia   . Hypertension   . Sleep apnea    severe OSA uses CPAP nightly AHI 37.6   History reviewed. No pertinent family history. Past Surgical History:  Procedure Laterality Date  . APPENDECTOMY    . CARDIAC CATHETERIZATION     care everywhere- August 2019  . COLONOSCOPY    . DRUG INDUCED ENDOSCOPY N/A 03/15/2019   Procedure: DRUG INDUCED ENDOSCOPY;  Surgeon: Jerrell Belfast, MD;  Location: [facility];  Service: ENT;  Laterality: N/A;  Drug induced sleep endoscopy  . EYE SURGERY Bilateral   . GANGLION CYST EXCISION Left    wrist  . IMPLANTATION OF HYPOGLOSSAL NERVE STIMULATOR  06/05/2019  IMPLANTATION OF HYPOGLOSSAL NERVE STIMULATOR on 06/05/2019  . IMPLANTATION OF HYPOGLOSSAL NERVE STIMULATOR Right 06/05/2019   Procedure: IMPLANTATION OF HYPOGLOSSAL NERVE STIMULATOR;  Surgeon: Jerrell Belfast, MD;   Location: [facility];  Service: ENT;  Laterality: Right;  right anterior lateral neck, right anterior chest, right lateral lower chest  . LASIK Right   . PROSTATECTOMY    . TONSILLECTOMY     Social History   Occupational History  . Not on file  Tobacco Use  . Smoking status: Former Smoker    Packs/day: 3.00    Quit date: 11/26/1993    Years since quitting: 26.9  . Smokeless tobacco: Never Used  Vaping Use  . Vaping Use: Never used  Substance and Sexual Activity  . Alcohol use: Yes    Comment: drinks 2-3 vodka drinks every night  . Drug use: Not Currently  . Sexual activity: Not on file

## 2020-11-11 ENCOUNTER — Ambulatory Visit: Payer: Medicare Other | Admitting: Physical Medicine and Rehabilitation

## 2020-11-11 ENCOUNTER — Other Ambulatory Visit: Payer: Self-pay

## 2020-11-11 VITALS — BP 171/61 | HR 68

## 2020-11-11 DIAGNOSIS — M47816 Spondylosis without myelopathy or radiculopathy, lumbar region: Secondary | ICD-10-CM | POA: Diagnosis not present

## 2020-11-11 DIAGNOSIS — M5442 Lumbago with sciatica, left side: Secondary | ICD-10-CM | POA: Diagnosis not present

## 2020-11-11 DIAGNOSIS — M722 Plantar fascial fibromatosis: Secondary | ICD-10-CM

## 2020-11-11 DIAGNOSIS — G8929 Other chronic pain: Secondary | ICD-10-CM

## 2020-11-11 DIAGNOSIS — M48061 Spinal stenosis, lumbar region without neurogenic claudication: Secondary | ICD-10-CM

## 2020-11-11 DIAGNOSIS — M5441 Lumbago with sciatica, right side: Secondary | ICD-10-CM

## 2020-11-11 NOTE — Progress Notes (Signed)
Better since last injection. Back is at least 50% better.  Numeric Pain Rating Scale and Functional Assessment Average Pain 4   In the last MONTH (on 0-10 scale) has pain interfered with the following?  1. General activity like being  able to carry out your everyday physical activities such as walking, climbing stairs, carrying groceries, or moving a chair?  Rating(3)

## 2020-11-12 ENCOUNTER — Ambulatory Visit: Payer: Medicare Other | Admitting: Podiatry

## 2020-11-12 ENCOUNTER — Ambulatory Visit (INDEPENDENT_AMBULATORY_CARE_PROVIDER_SITE_OTHER): Payer: Medicare Other

## 2020-11-12 ENCOUNTER — Encounter: Payer: Self-pay | Admitting: Physical Medicine and Rehabilitation

## 2020-11-12 ENCOUNTER — Encounter: Payer: Self-pay | Admitting: Podiatry

## 2020-11-12 DIAGNOSIS — M722 Plantar fascial fibromatosis: Secondary | ICD-10-CM | POA: Diagnosis not present

## 2020-11-12 DIAGNOSIS — M19071 Primary osteoarthritis, right ankle and foot: Secondary | ICD-10-CM

## 2020-11-12 DIAGNOSIS — M7750 Other enthesopathy of unspecified foot: Secondary | ICD-10-CM | POA: Diagnosis not present

## 2020-11-12 DIAGNOSIS — M79671 Pain in right foot: Secondary | ICD-10-CM

## 2020-11-12 DIAGNOSIS — M48061 Spinal stenosis, lumbar region without neurogenic claudication: Secondary | ICD-10-CM | POA: Insufficient documentation

## 2020-11-12 DIAGNOSIS — G8929 Other chronic pain: Secondary | ICD-10-CM

## 2020-11-12 NOTE — Progress Notes (Signed)
[name] - 80 y.o. male MRN [id number]  Date of birth: Feb 14, 1941  Office Visit Note: Visit Date: 11/11/2020 PCP: Egbert Garibaldi, PA-C Referred by: Egbert Garibaldi, PA-C  Subjective: Chief Complaint  Patient presents with  . Lower Back - Pain   HPI: Ricky Zavala is a 80 y.o. male who comes in today For evaluation management of chronic worsening low back pain with occasional hip and leg pain.  Patient is status post 2 bilateral L5 transforaminal epidural steroid injections with overall 50% relief that seems to be sticking with him.  He rates his average pain is 4 out of 10.  He reports his worst problem right now is plantar fasciitis that he has been dealing with for 20 or 30 years.  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  He has been seeing the podiatrist at [facility].  In the past we had made referral to Dr. Eunice Blase and Dr. Meridee Score in the past.  In terms of his back he is doing okay at this point he would like to be better but he actually is better than it was.  His history is such that prior radiofrequency ablation of the lumbar spine did quite well but repeat ablation helped some but did not take away some of the sharper pain that he was having.  Last MRI in 2019 did show some foraminal narrowing at L5 and lateral recess narrowing at L4-5.  L5 injections diagnostically have helped.  MRI was from 2019 and now he is unable to have an MRI do to hypoglossal nerve stimulator that was implanted after that.  Depending on how he does concern would be potential for CT scan just to rule out anything new.  Otherwise he seems to be doing okay.  Review of Systems  Musculoskeletal: Positive for back pain.       Foot pain  All other systems reviewed and are negative.  Otherwise per HPI.  Assessment & Plan: Visit Diagnoses:    ICD-10-CM   1. Spondylosis without  myelopathy or radiculopathy, lumbar region  M47.816   2. Chronic bilateral low back pain with bilateral sciatica  M54.42    M54.41    G89.29   3. Stenosis of lateral recess of lumbar spine  M48.061   4. Foraminal stenosis of lumbar region  M48.061   5. Plantar fasciitis of right foot  M72.2      Plan: Findings:  Chronic worsening severe axial low back pain now with some level of pain from lateral recess narrowing or foraminal narrowing.  To diagnostic and therapeutic epidural injections have helped him above 50%.  Radiofrequency ablation was performed prior to that that was repeat ablation but did not seem to help as well.  Reviewed images again well-placed ablation.  Likely has some issues with his lower back with arthritic changes and decreased movement and ongoing history of plantar fasciitis it keeps him at a lower level of activity.  We will see him back as needed at this point hopefully he will do well for a while depending on relief would look at again repeating ablation at some point versus intermittent epidural injection.  Could look at CT scan of the lumbar spine to see if there is anything new over the last couple of years that would correspond with the increasing pain and lack of help from  the ablation.  Cannot have MRI at this point Duda hypoglossal nerve stimulator.    Meds & Orders: No orders of the defined types were placed in this encounter.  No orders of the defined types were placed in this encounter.   Follow-up: Return if symptoms worsen or fail to improve.   Procedures: No procedures performed      Clinical History: MRI LUMBAR SPINE WITHOUT CONTRAST    TECHNIQUE:  Multiplanar, multisequence MR imaging of the lumbar spine was  performed. No intravenous contrast was administered.    COMPARISON: None.    FINDINGS:  Segmentation: The lowest lumbar type non-rib-bearing vertebra is  labeled as L5.    Alignment: 7 mm of anterolisthesis at L5-S1 associated with  chronic  bilateral pars defects.    4 mm degenerative retrolisthesis at L2-3 with 2 mm degenerative  retrolisthesis L3-4.    Vertebrae: Marrow heterogeneity is present. Although this can be  caused by marrow infiltrative processes, the most common causes  include anemia, smoking, obesity, or advancing age.    1.5 cm in diameter hemangioma in the T12 vertebral body.    Type 1 degenerative endplate findings along the left inferior  endplate of L4. Type 2 degenerative endplate findings at Z6-S0.    Conus medullaris and cauda equina: Conus extends to the L1 level.  Conus and cauda equina appear normal.    Paraspinal and other soft tissues: Fluid signal intensity lesions of  the left kidney including what is probably a larger lesion along the  lower pole. These may well be cysts better technically nonspecific  and only partially included in imaging.    No pathologic retroperitoneal adenopathy is observed. No appreciable  abdominal aortic aneurysm.    Disc levels:    L1-2: No impingement. Mild disc bulge.    L2-3: No impingement. Diffuse disc bulge.    L3-4: Borderline bilateral subarticular lateral recess stenosis due  to disc bulge and facet arthropathy.    L4-5: Borderline right subarticular lateral recess stenosis due to  disc bulge.    L5-S1: Mild to moderate bilateral foraminal stenosis due to disc  uncovering and subluxation along with mild facet arthropathy. This  is slightly worse on the right compared to the left.    IMPRESSION:  1. 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  2. Borderline impingement at L3-4 and L4-5 due to spondylosis and  degenerative disc disease.  3. Marrow heterogeneity is present. Although this can be caused by  marrow infiltrative processes, the most common causes include  anemia,  smoking, obesity, or advancing age.  4. We image the margin of several fluid signal intensity lesions in  the vicinity of the left kidney. These are probably cysts but only a  small minority of the volume of these lesions is included on today's  imaging.      Electronically Signed  By: Van Clines M.D.  On: 05/02/2018 16:55   He reports that he quit smoking about 26 years ago. He smoked 3.00 packs per day. He has never used smokeless tobacco. No results for input(s): HGBA1C, LABURIC in the last 8760 hours.  Objective:  VS:  HT:    WT:   BMI:     BP:(!) 171/61  HR:68bpm  TEMP: ( )  RESP:  Physical Exam Vitals and nursing note reviewed.  Constitutional:      General:  He is not in acute distress.    Appearance: Normal appearance. He is not ill-appearing.  HENT:     Head: Normocephalic and atraumatic.     Right Ear: External ear normal.     Left Ear: External ear normal.     Nose: No congestion.  Eyes:     Extraocular Movements: Extraocular movements intact.  Cardiovascular:     Rate and Rhythm: Normal rate.     Pulses: Normal pulses.  Pulmonary:     Effort: Pulmonary effort is normal. No respiratory distress.  Abdominal:     General: There is no distension.     Palpations: Abdomen is soft.  Musculoskeletal:        General: No tenderness or signs of injury.     Cervical back: Neck supple.     Right lower leg: No edema.     Left lower leg: No edema.     Comments: Patient has good distal strength without clonus.  He ambulates with a shuffling gait with forward flexed lumbar spine.  He is very stiff with extension some pain with facet loading.  No pain over the greater trochanters.  Brief examination of the right foot shows pain over the medial distal plantar fascia but not so much to the heel.  Skin:    Findings: No erythema or rash.  Neurological:     General: No focal deficit present.     Mental Status: He is alert and oriented to person, place, and time.      Sensory: No sensory deficit.     Motor: No weakness or abnormal muscle tone.     Coordination: Coordination normal.  Psychiatric:        Mood and Affect: Mood normal.        Behavior: Behavior normal.     Ortho Exam  Imaging: No results found.  Past Medical/Family/Surgical/Social History: Medications & Allergies reviewed per EMR, new medications updated. Patient Active Problem List   Diagnosis Date Noted  . Foraminal stenosis of lumbar region 11/12/2020  . Coronary artery disease involving native coronary artery of native heart without angina pectoris 02/11/2020  . Obstructive sleep apnea 03/15/2019  . Cervical spondylosis without myelopathy 08/10/2018  . Spondylosis without myelopathy or radiculopathy, lumbar region 08/10/2018  . 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 DOE (dyspnea on exertion) 05/07/2018  . Chronic bilateral low back pain without sciatica 04/18/2018  . Abnormal EKG 01/29/2018  . Chest pain 01/29/2018  . Hyponatremia 01/29/2018  . Left ventricular hypertrophy 01/29/2018  . ED (erectile dysfunction) 06/29/2012  . Essential hypertension 06/29/2012  . Barrett's esophagus 06/29/2012  . Hypercholesterolemia 06/29/2012  . Lower urinary tract symptoms (LUTS) 06/29/2012  . Prostate cancer ([place]) 06/29/2012  . SUI (stress urinary incontinence), male 06/29/2012   Past Medical History:  Diagnosis Date  . Anxiety   . Barrett's esophagus   . Cancer [facility])    prostate cancer-had surgery  . Chronic back pain   . COPD (chronic obstructive pulmonary disease) ([place])   . ETOH abuse    daily 2-3-vodka drinks  . GERD (gastroesophageal reflux disease)   . Hypercholesteremia   . Hypertension   . Sleep apnea    severe OSA uses CPAP nightly AHI 37.6   History reviewed. No pertinent family history. Past Surgical History:  Procedure Laterality Date  . APPENDECTOMY    . CARDIAC CATHETERIZATION     care everywhere- August 2019  . COLONOSCOPY    . DRUG INDUCED ENDOSCOPY  N/A 03/15/2019  Procedure: DRUG INDUCED ENDOSCOPY;  Surgeon: Jerrell Belfast, MD;  Location: [place];  Service: ENT;  Laterality: N/A;  Drug induced sleep endoscopy  . EYE SURGERY Bilateral   . GANGLION CYST EXCISION Left    wrist  . IMPLANTATION OF HYPOGLOSSAL NERVE STIMULATOR  06/05/2019   IMPLANTATION OF HYPOGLOSSAL NERVE STIMULATOR on 06/05/2019  . IMPLANTATION OF HYPOGLOSSAL NERVE STIMULATOR Right 06/05/2019   Procedure: IMPLANTATION OF HYPOGLOSSAL NERVE STIMULATOR;  Surgeon: Jerrell Belfast, MD;  Location: [place];  Service: ENT;  Laterality: Right;  right anterior lateral neck, right anterior chest, right lateral lower chest  . LASIK Right   . PROSTATECTOMY    . TONSILLECTOMY     Social History   Occupational History  . Not on file  Tobacco Use  . Smoking status: Former Smoker    Packs/day: 3.00    Quit date: 11/26/1993    Years since quitting: 26.9  . Smokeless tobacco: Never Used  Vaping Use  . Vaping Use: Never used  Substance and Sexual Activity  . Alcohol use: Yes    Comment: drinks 2-3 vodka drinks every night  . Drug use: Not Currently  . Sexual activity: Not on file

## 2020-11-12 NOTE — Progress Notes (Signed)
This is a patient that has been seeing Dr. Posey Pronto in the past and had seen [facility] for multiple shockwave therapies.  He states that he has spent a lot of money on this including inserts which she gave back to [place] because they were working.  States that he still has a lot of pain right and here as he points to the plantar aspect of the first tarsometatarsal joint.  Objective: Vital signs are stable alert oriented x3.  Pulses are palpable.  There is no erythema edema cellulitis drainage or odor he has pain with palpation of the plantar aspect of the first TMT joint with some palpable soft tissue between the skin and the bone itself.  Lelan Pons did radiographs today and do not demonstrate any type of foreign body they do not demonstrate any type of significant osteoarthritic change with hypertrophic bone or anything of the sort.  He severely pronated.  Assessment: Severe pronation right foot resulting in pain beneath the first TMT joint.  There is also a small palpable mass that is present.  Plan: At this point I will request an MRI for surgical evaluation of this area.  He understands and is amenable to it all conservative therapies have failed this is for diagnostic and differential diagnoses as well as surgical planning.

## 2020-11-17 ENCOUNTER — Other Ambulatory Visit: Payer: Self-pay | Admitting: Podiatry

## 2020-11-17 DIAGNOSIS — M19071 Primary osteoarthritis, right ankle and foot: Secondary | ICD-10-CM

## 2020-11-22 ENCOUNTER — Other Ambulatory Visit: Payer: Self-pay

## 2020-11-22 ENCOUNTER — Emergency Department (HOSPITAL_BASED_OUTPATIENT_CLINIC_OR_DEPARTMENT_OTHER)
Admission: EM | Admit: 2020-11-22 | Discharge: 2020-11-22 | Disposition: A | Discharge status: Home / Self Care | Payer: Medicare Other | Attending: Emergency Medicine | Admitting: Emergency Medicine

## 2020-11-22 ENCOUNTER — Encounter (HOSPITAL_BASED_OUTPATIENT_CLINIC_OR_DEPARTMENT_OTHER): Payer: Self-pay | Admitting: Emergency Medicine

## 2020-11-22 DIAGNOSIS — J449 Chronic obstructive pulmonary disease, unspecified: Secondary | ICD-10-CM | POA: Diagnosis not present

## 2020-11-22 DIAGNOSIS — Z7902 Long term (current) use of antithrombotics/antiplatelets: Secondary | ICD-10-CM | POA: Diagnosis not present

## 2020-11-22 DIAGNOSIS — I10 Essential (primary) hypertension: Secondary | ICD-10-CM | POA: Insufficient documentation

## 2020-11-22 DIAGNOSIS — Z79899 Other long term (current) drug therapy: Secondary | ICD-10-CM | POA: Diagnosis not present

## 2020-11-22 DIAGNOSIS — Z87891 Personal history of nicotine dependence: Secondary | ICD-10-CM | POA: Diagnosis not present

## 2020-11-22 DIAGNOSIS — Z8546 Personal history of malignant neoplasm of prostate: Secondary | ICD-10-CM | POA: Diagnosis not present

## 2020-11-22 DIAGNOSIS — I2510 Atherosclerotic heart disease of native coronary artery without angina pectoris: Secondary | ICD-10-CM | POA: Insufficient documentation

## 2020-11-22 DIAGNOSIS — Z7951 Long term (current) use of inhaled steroids: Secondary | ICD-10-CM | POA: Diagnosis not present

## 2020-11-22 DIAGNOSIS — Z4800 Encounter for change or removal of nonsurgical wound dressing: Secondary | ICD-10-CM | POA: Insufficient documentation

## 2020-11-22 DIAGNOSIS — R040 Epistaxis: Secondary | ICD-10-CM | POA: Diagnosis not present

## 2020-11-22 DIAGNOSIS — J3489 Other specified disorders of nose and nasal sinuses: Secondary | ICD-10-CM | POA: Diagnosis not present

## 2020-11-22 NOTE — ED Provider Notes (Signed)
[facility] Provider Note   CSN: [id number] Arrival date & time: 11/22/20  1324     History Chief Complaint  Patient presents with   Follow-up    For nosebleed with packing removal needed    Ricky Zavala is a 80 y.o. male who presents with request for removal of nasal packing that was placed at hospital in [place] early on the morning of 3/5, approximately 36 hours ago.  Patient states that he woke up in the middle of the night went to the restroom and when he blew his nose large amount of blood came out and proceeded to "drain blood like a fountain" and they could not get it to stop.  EMS was called and patient was taken to the emergency department where [facility] was placed in the right nare.  Patient states that the nose has not bled around the packing since yesterday.  Denies tasting any blood down his throat since the packing was placed.  Denies any cough, chest pain, shortness of breath, headache, lightheadedness, blurred vision, double vision.  I personally read this patient medical records.  History of hypertension, GERD, history of prostate cancer, status post treatment, COPD.  He is on Plavix.  He presents with his fiance at the bedside.  HPI     Past Medical History:  Diagnosis Date   Anxiety    Barrett's esophagus    Cancer ([place])    prostate cancer-had surgery   Chronic back pain    COPD (chronic obstructive pulmonary disease) ([facility])    ETOH abuse    daily 2-3-vodka drinks   GERD (gastroesophageal reflux disease)    Hypercholesteremia    Hypertension    Sleep apnea    severe OSA uses CPAP nightly AHI 37.6    Patient Active Problem List   Diagnosis Date Noted   Foraminal stenosis of lumbar region 11/12/2020   Coronary artery disease involving native coronary artery of native heart without angina pectoris 02/11/2020   Obstructive sleep apnea 03/15/2019   Cervical spondylosis without myelopathy  08/10/2018   Spondylosis without myelopathy or radiculopathy, lumbar region 08/10/2018   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   DOE (dyspnea on exertion) 05/07/2018   Chronic bilateral low back pain without sciatica 04/18/2018   Abnormal EKG 01/29/2018   Chest pain 01/29/2018   Hyponatremia 01/29/2018   Left ventricular hypertrophy 01/29/2018   ED (erectile dysfunction) 06/29/2012   Essential hypertension 06/29/2012   Barrett's esophagus 06/29/2012   Hypercholesterolemia 06/29/2012   Lower urinary tract symptoms (LUTS) 06/29/2012   Prostate cancer ([place]) 06/29/2012   SUI (stress urinary incontinence), male 06/29/2012    Past Surgical History:  Procedure Laterality Date   APPENDECTOMY     CARDIAC CATHETERIZATION     care everywhere- August 2019   COLONOSCOPY     DRUG INDUCED ENDOSCOPY N/A 03/15/2019   Procedure: DRUG INDUCED ENDOSCOPY;  Surgeon: Jerrell Belfast, MD;  Location: [place];  Service: ENT;  Laterality: N/A;  Drug induced sleep endoscopy   EYE SURGERY Bilateral    GANGLION CYST EXCISION Left    wrist   IMPLANTATION OF HYPOGLOSSAL NERVE STIMULATOR  06/05/2019   IMPLANTATION OF HYPOGLOSSAL NERVE STIMULATOR on 06/05/2019   IMPLANTATION OF HYPOGLOSSAL NERVE STIMULATOR Right 06/05/2019   Procedure: IMPLANTATION OF HYPOGLOSSAL NERVE STIMULATOR;  Surgeon: Jerrell Belfast, MD;  Location: [facility] OR;  Service: ENT;  Laterality: Right;  right anterior lateral neck, right anterior chest, right lateral lower chest  LASIK Right    PROSTATECTOMY     TONSILLECTOMY         No family history on file.  Social History   Tobacco Use   Smoking status: Former Smoker    Packs/day: 3.00    Quit date: 11/26/1993    Years since quitting: 27.0   Smokeless tobacco: Never Used  Vaping Use   Vaping Use: Never used  Substance Use Topics   Alcohol use: Yes    Comment: drinks 2-3 vodka drinks every night   Drug use: Not Currently    Home  Medications Prior to Admission medications   Medication Sig Start Date End Date Taking? Authorizing Provider  ALPRAZolam Duanne Moron) 0.5 MG tablet Take 0.5 mg by mouth 2 (two) times daily.  09/01/15   [provider]  amLODipine (NORVASC) 2.5 MG tablet Take 2.5 mg by mouth daily. 01/28/20   [provider]  amLODipine (NORVASC) 5 MG tablet Take 5 mg by mouth daily.  09/09/15   [provider]  ascorbic acid (VITAMIN C) 1000 MG tablet Take by mouth.    [provider]  clopidogrel (PLAVIX) 75 MG tablet Take by mouth. 10/15/20   [provider]  cyanocobalamin 1000 MCG tablet Take 1,000 mcg by mouth daily.     [provider]  ergocalciferol (VITAMIN D2) 50000 units capsule Take 50,000 Units by mouth every Tuesday.     [provider]  folic acid (FOLVITE) 1 MG tablet Take 1 mg by mouth daily.  05/31/10   [provider]  labetalol (NORMODYNE) 200 MG tablet Take 600 mg by mouth 2 (two) times daily.  04/23/18   [provider]  latanoprost (XALATAN) 0.005 % ophthalmic solution Place 1 drop into both eyes at bedtime.  04/20/18   [provider]  lisinopril-hydrochlorothiazide (PRINZIDE,ZESTORETIC) 20-25 MG tablet Take 1 tablet by mouth daily.  05/31/10   [provider]  Multiple Vitamins-Minerals (PRESERVISION AREDS 2 PO) Take 1 tablet by mouth 2 (two) times daily.     [provider]  omeprazole (PRILOSEC) 40 MG capsule Take 40 mg by mouth 2 (two) times daily.  05/31/10   [provider]  pravastatin (PRAVACHOL) 10 MG tablet Take 10 mg by mouth daily. 11/21/19   [provider]  spironolactone (ALDACTONE) 25 MG tablet Take by mouth. 12/10/18   [provider]  traMADol (ULTRAM) 50 MG tablet Take 50 mg by mouth.  04/18/20   [provider]  Travoprost, BAK Free, (TRAVATAN) 0.004 % SOLN ophthalmic solution 1 drop nightly. 06/19/12   [provider]  TRELEGY ELLIPTA  100-62.5-25 MCG/INH AEPB Take 1 puff by mouth daily. 01/30/20   [provider]  triamcinolone cream (KENALOG) 0.1 % SMARTSIG:Topical 1 to 2 Times Daily PRN 04/06/20   [provider]  zolpidem (AMBIEN) 10 MG tablet Take 10 mg by mouth at bedtime.  05/31/10   [provider]    Allergies    Patient has no known allergies.  Review of Systems   Review of Systems  Constitutional: Negative.   HENT: Positive for nosebleeds.        Wants packing removed  Respiratory: Negative.   Cardiovascular: Negative.   Gastrointestinal: Negative.   Genitourinary: Negative.   Musculoskeletal: Negative.   Neurological: Negative.   Hematological: Bruises/bleeds easily.    Physical Exam Updated Vital Signs BP (!) 159/90 (BP Location: Left Arm)    Pulse 68    Temp 97.8 F (36.6 C) (Oral)  Resp 20    Ht 5\' 10"  (1.778 m)    Wt 99.8 kg    SpO2 98%    BMI 31.57 kg/m   Physical Exam Vitals and nursing note reviewed.  Constitutional:      Appearance: He is obese.  HENT:     Head: Normocephalic and atraumatic.     Nose: Rhinorrhea present. No septal deviation or mucosal edema. Rhinorrhea is clear.     Left Nostril: No epistaxis or septal hematoma.      Mouth/Throat:     Mouth: Mucous membranes are moist.     Pharynx: Oropharynx is clear. Uvula midline. No pharyngeal swelling, oropharyngeal exudate, posterior oropharyngeal erythema or uvula swelling.     Tonsils: No tonsillar exudate.  Eyes:     General: Lids are normal. Vision grossly intact. No scleral icterus.       Right eye: No discharge.        Left eye: No discharge.     Extraocular Movements: Extraocular movements intact.     Conjunctiva/sclera: Conjunctivae normal.     Pupils: Pupils are equal, round, and reactive to light.  Neck:     Trachea: Trachea and phonation normal.  Cardiovascular:     Rate and Rhythm: Normal rate and regular rhythm.     Pulses: Normal pulses.     Heart sounds: No murmur  heard.   Pulmonary:     Effort: Pulmonary effort is normal. No respiratory distress.     Breath sounds: Normal breath sounds. No wheezing or rales.  Abdominal:     General: There is no distension.  Musculoskeletal:        General: No deformity.     Cervical back: Neck supple. No tenderness or crepitus. No pain with movement, spinous process tenderness or muscular tenderness.     Right lower leg: No edema.     Left lower leg: No edema.  Lymphadenopathy:     Cervical: No cervical adenopathy.  Skin:    General: Skin is warm and dry.     Capillary Refill: Capillary refill takes less than 2 seconds.  Neurological:     General: No focal deficit present.     Mental Status: He is alert and oriented to person, place, and time. Mental status is at baseline.  Psychiatric:        Mood and Affect: Mood normal.     ED Results / Procedures / Treatments   Labs (all labs ordered are listed, but only abnormal results are displayed) Labs Reviewed - No data to display  EKG None  Radiology No results found.  Procedures Procedures   Medications Ordered in ED Medications - No data to display  ED Course  I have reviewed the triage vital signs and the nursing notes.  Pertinent labs & imaging results that were available during my care of the patient were reviewed by me and considered in my medical decision making (see chart for details).    MDM Rules/Calculators/A&P                         80 year old male who presents with request to remove his nasal packing that was placed approximately 36 hours ago. Patient anticoagulated on plavix.   Hypertensive on intake.  Cardiopulmonary exam is normal.  Patient with Rhino Rocket in the right nare, without bleeding around the packing.  There is a small amount of clear rhinorrhea from the right nare.  No epistaxis or  septal hematoma of the left nare.  Extensive discussion the patient regarding risk of rebleed with removal of Rhino rocket, given  packing was placed yesterday and patient is on anticoagulation.  Explained protocol to leave Rhino Rocket in place for approximately 5 days and for it to be removed by ear nose and throat provider.  Patient initially expressed wishes to proceed with removal of the packing, however after discussion with his wife this provider was called back to the room. The patient then explained that he desired to leave the packing in place and follow-up with ear nose and throat for removal later this week.  Feel this was a wise decision.  No further work-up warranted in the ED at this time.  Delmer voiced understanding of his medical evaluation and treatment plan.  Each of his questions was answered to his expressed satisfaction.  Return precautions given.  Patient is well-appearing, stable, appropriate for discharge at this time.  This chart was dictated using voice recognition software, Dragon. Despite the best efforts of this provider to proofread and correct errors, errors may still occur which can change documentation meaning.  Final Clinical Impression(s) / ED Diagnoses Final diagnoses:  Right-sided nosebleed    Rx / DC Orders ED Discharge Orders    None       Aura Dials 11/22/20 1659    Drenda Freeze, MD 11/22/20 2119

## 2020-11-22 NOTE — ED Notes (Signed)
AVS reviewed with client and also copy given to client as well. Informed of ENT Follow up as recommended by ED provider. Opportunity for questions provided

## 2020-11-22 NOTE — ED Triage Notes (Signed)
Pt seen at hospital in [place], [facility] for nosebleed on 11/21/2020. Pt presents with nasal packing in right nostril that patient is requesting to have removed. Pt denies bleeding around packing, reports clear drainage noted from site.

## 2020-11-22 NOTE — Discharge Instructions (Addendum)
You were seen in the ER today for evaluation of your nasal packing.  You made the decision not to remove it today, and to leave the packing in place for the recommend amount of time in the 5 days.  Below is the contact information for the on-call ear nose and throat doctor, Dr. Janace Hoard.  Please call his office for same tomorrow to schedule a follow-up ointment approximately 3 days for removal of your packing.  Return to the emergency department sooner if you develop recurrent nosebleed around the packing, develop any nausea or vomiting that does not stop, chest pain, shortness of breath, or any other new severe symptoms.

## 2020-11-25 NOTE — Progress Notes (Signed)
Ricky Zavala - 80 y.o. male MRN [id number]  Date of birth: August 11, 1941  Office Visit Note: Visit Date: 10/14/2020 PCP: Egbert Garibaldi, PA-C Referred by: Egbert Garibaldi, PA-C  Subjective: Chief Complaint  Patient presents with  . Lower Back - Pain   HPI:  Ricky Zavala is a 80 y.o. male who comes in today For planned repeat L5 transforaminal injection bilaterally.  Last injection in January did help him greatly for the first couple of weeks and is still helping him overall although the pain is starting to come back a little bit.  His average pain now is only a 4 out of 10 but still functionally limiting for him.  He is full notes can be reviewed we had a difficult time with him with pain relief from his spine recently.  He has done well in the past with bilateral facet joint blocks for many years and then we completed radiofrequency ablation of those facet joints and he did really well with that but then with a repeat ablation recently he just has not gotten as much relief.  He has worse pain with walking and bending he still has consistent facet joint type pain but he clearly has some lateral recess narrowing between 4 and 5 and he did get diagnostic relief with L5 transforaminal injection.  Depending on the relief with today we would look at more medication management may be regrouping with therapy.  His case is complicated by multiple medical issues at this point.  ROS Otherwise per HPI.  Assessment & Plan: Visit Diagnoses:    ICD-10-CM   1. Lumbar radiculopathy  M54.16 XR C-ARM NO REPORT    Epidural Steroid injection    methylPREDNISolone acetate (DEPO-MEDROL) injection 80 mg    Plan: No additional findings.   Meds & Orders:  Meds ordered this encounter  Medications  . methylPREDNISolone acetate (DEPO-MEDROL) injection 80 mg    Orders Placed This Encounter  Procedures  . XR C-ARM NO REPORT  . Epidural Steroid injection    Follow-up: Return if symptoms worsen or fail to improve.    Procedures: No procedures performed  Lumbosacral Transforaminal Epidural Steroid Injection - Sub-Pedicular Approach with Fluoroscopic Guidance  Patient: Ricky Zavala      Date of Birth: 09-Jun-1941 MRN: [id number] PCP: Egbert Garibaldi, PA-C      Visit Date: 10/14/2020   Universal Protocol:    Date/Time: 10/14/2020  Consent Given By: the patient  Position: PRONE  Additional Comments: Vital signs were monitored before and after the procedure. Patient was prepped and draped in the usual sterile fashion. The correct patient, procedure, and site was verified.   Injection Procedure Details:   Procedure diagnoses: Lumbar radiculopathy [M54.16]    Meds Administered:  Meds ordered this encounter  Medications  . methylPREDNISolone acetate (DEPO-MEDROL) injection 80 mg    Laterality: Bilateral  Location/Site:  L5-S1  Needle:5.0 in., 22 ga.  Short bevel or Quincke spinal needle  Needle Placement: Transforaminal  Findings:    -Comments: Excellent flow of contrast along the nerve, nerve root and into the epidural space.  Procedure Details: After squaring off the end-plates to get a true AP view, the C-arm was positioned so that an oblique view of the foramen as noted above was visualized. The target area is just inferior to the "nose of the scotty dog" or sub pedicular. The soft tissues overlying this structure were infiltrated with 2-3 ml. of 1% Lidocaine without Epinephrine.  The spinal needle was inserted toward  the target using a "trajectory" view along the fluoroscope beam.  Under AP and lateral visualization, the needle was advanced so it did not puncture dura and was located close the 6 O'Clock position of the pedical in AP tracterory. Biplanar projections were used to confirm position. Aspiration was confirmed to be negative for CSF and/or blood. A 1-2 ml. volume of Isovue-250 was injected and flow of contrast was noted at each level. Radiographs were obtained for  documentation purposes.   After attaining the desired flow of contrast documented above, a 0.5 to 1.0 ml test dose of 0.25% Marcaine was injected into each respective transforaminal space.  The patient was observed for 90 seconds post injection.  After no sensory deficits were reported, and normal lower extremity motor function was noted,   the above injectate was administered so that equal amounts of the injectate were placed at each foramen (level) into the transforaminal epidural space.   Additional Comments:  The patient tolerated the procedure well Dressing: 2 x 2 sterile gauze and Band-Aid    Post-procedure details: Patient was observed during the procedure. Post-procedure instructions were reviewed.  Patient left the clinic in stable condition.      Clinical History: MRI LUMBAR SPINE WITHOUT CONTRAST    TECHNIQUE:  Multiplanar, multisequence MR imaging of the lumbar spine was  performed. No intravenous contrast was administered.    COMPARISON: None.    FINDINGS:  Segmentation: The lowest lumbar type non-rib-bearing vertebra is  labeled as L5.    Alignment: 7 mm of anterolisthesis at L5-S1 associated with chronic  bilateral pars defects.    4 mm degenerative retrolisthesis at L2-3 with 2 mm degenerative  retrolisthesis L3-4.    Vertebrae: Marrow heterogeneity is present. Although this can be  caused by marrow infiltrative processes, the most common causes  include anemia, smoking, obesity, or advancing age.    1.5 cm in diameter hemangioma in the T12 vertebral body.    Type 1 degenerative endplate findings along the left inferior  endplate of L4. Type 2 degenerative endplate findings at W1-X9.    Conus medullaris and cauda equina: Conus extends to the L1 level.  Conus and cauda equina appear normal.    Paraspinal and other soft tissues: Fluid signal intensity lesions of  the left kidney including what is probably a larger lesion along the  lower  pole. These may well be cysts better technically nonspecific  and only partially included in imaging.    No pathologic retroperitoneal adenopathy is observed. No appreciable  abdominal aortic aneurysm.    Disc levels:    L1-2: No impingement. Mild disc bulge.    L2-3: No impingement. Diffuse disc bulge.    L3-4: Borderline bilateral subarticular lateral recess stenosis due  to disc bulge and facet arthropathy.    L4-5: Borderline right subarticular lateral recess stenosis due to  disc bulge.    L5-S1: Mild to moderate bilateral foraminal stenosis due to disc  uncovering and subluxation along with mild facet arthropathy. This  is slightly worse on the right compared to the left.    IMPRESSION:  1. 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  2. Borderline impingement at L3-4 and L4-5 due to spondylosis and  degenerative disc disease.  3. Marrow heterogeneity is present. Although this can be caused by  marrow infiltrative processes, the most common causes include  anemia,  smoking, obesity, or advancing age.  4. We image the margin of several fluid signal intensity lesions in  the vicinity of the left kidney. These are probably cysts but only a  small minority of the volume of these lesions is included on today's  imaging.      Electronically Signed  By: Van Clines M.D.  On: 05/02/2018 16:55     Objective:  VS:  HT:    WT:   BMI:     BP:120/73  HR:73bpm  TEMP: ( )  RESP:  Physical Exam Vitals and nursing note reviewed.  Constitutional:      General: He is not in acute distress.    Appearance: Normal appearance. He is not ill-appearing.  HENT:     Head: Normocephalic and atraumatic.     Right Ear: External ear normal.     Left Ear: External ear normal.     Nose: No congestion.  Eyes:     Extraocular  Movements: Extraocular movements intact.  Cardiovascular:     Rate and Rhythm: Normal rate.     Pulses: Normal pulses.  Pulmonary:     Effort: Pulmonary effort is normal. No respiratory distress.  Abdominal:     General: There is no distension.     Palpations: Abdomen is soft.  Musculoskeletal:        General: No tenderness or signs of injury.     Cervical back: Neck supple.     Right lower leg: No edema.     Left lower leg: No edema.     Comments: Patient has good distal strength without clonus.  Skin:    Findings: No erythema or rash.  Neurological:     General: No focal deficit present.     Mental Status: He is alert and oriented to person, place, and time.     Sensory: No sensory deficit.     Motor: No weakness or abnormal muscle tone.     Coordination: Coordination normal.  Psychiatric:        Mood and Affect: Mood normal.        Behavior: Behavior normal.      Imaging: No results found.

## 2020-11-25 NOTE — Procedures (Signed)
Lumbosacral Transforaminal Epidural Steroid Injection - Sub-Pedicular Approach with Fluoroscopic Guidance  Patient: Ricky Zavala      Date of Birth: 1940-09-30 MRN: [id number] PCP: Egbert Garibaldi, PA-C      Visit Date: 10/14/2020   Universal Protocol:    Date/Time: 10/14/2020  Consent Given By: the patient  Position: PRONE  Additional Comments: Vital signs were monitored before and after the procedure. Patient was prepped and draped in the usual sterile fashion. The correct patient, procedure, and site was verified.   Injection Procedure Details:   Procedure diagnoses: Lumbar radiculopathy [M54.16]    Meds Administered:  Meds ordered this encounter  Medications  . methylPREDNISolone acetate (DEPO-MEDROL) injection 80 mg    Laterality: Bilateral  Location/Site:  L5-S1  Needle:5.0 in., 22 ga.  Short bevel or Quincke spinal needle  Needle Placement: Transforaminal  Findings:    -Comments: Excellent flow of contrast along the nerve, nerve root and into the epidural space.  Procedure Details: After squaring off the end-plates to get a true AP view, the C-arm was positioned so that an oblique view of the foramen as noted above was visualized. The target area is just inferior to the "nose of the scotty dog" or sub pedicular. The soft tissues overlying this structure were infiltrated with 2-3 ml. of 1% Lidocaine without Epinephrine.  The spinal needle was inserted toward the target using a "trajectory" view along the fluoroscope beam.  Under AP and lateral visualization, the needle was advanced so it did not puncture dura and was located close the 6 O'Clock position of the pedical in AP tracterory. Biplanar projections were used to confirm position. Aspiration was confirmed to be negative for CSF and/or blood. A 1-2 ml. volume of Isovue-250 was injected and flow of contrast was noted at each level. Radiographs were obtained for documentation purposes.   After attaining the  desired flow of contrast documented above, a 0.5 to 1.0 ml test dose of 0.25% Marcaine was injected into each respective transforaminal space.  The patient was observed for 90 seconds post injection.  After no sensory deficits were reported, and normal lower extremity motor function was noted,   the above injectate was administered so that equal amounts of the injectate were placed at each foramen (level) into the transforaminal epidural space.   Additional Comments:  The patient tolerated the procedure well Dressing: 2 x 2 sterile gauze and Band-Aid    Post-procedure details: Patient was observed during the procedure. Post-procedure instructions were reviewed.  Patient left the clinic in stable condition.

## 2020-11-26 ENCOUNTER — Other Ambulatory Visit: Payer: Medicare Other

## 2020-12-01 ENCOUNTER — Ambulatory Visit: Payer: Medicare Other | Admitting: Podiatry

## 2020-12-05 ENCOUNTER — Other Ambulatory Visit: Payer: Medicare Other

## 2020-12-06 ENCOUNTER — Ambulatory Visit
Admission: RE | Admit: 2020-12-06 | Discharge: 2020-12-06 | Disposition: A | Discharge status: Home / Self Care | Payer: Medicare Other | Source: Ambulatory Visit | Attending: Podiatry | Admitting: Podiatry

## 2020-12-06 ENCOUNTER — Other Ambulatory Visit: Payer: Self-pay

## 2020-12-06 DIAGNOSIS — M19071 Primary osteoarthritis, right ankle and foot: Secondary | ICD-10-CM

## 2020-12-06 DIAGNOSIS — M7750 Other enthesopathy of unspecified foot: Secondary | ICD-10-CM

## 2020-12-06 DIAGNOSIS — G8929 Other chronic pain: Secondary | ICD-10-CM

## 2020-12-15 ENCOUNTER — Ambulatory Visit: Payer: Medicare Other | Admitting: Podiatry

## 2020-12-15 ENCOUNTER — Other Ambulatory Visit: Payer: Self-pay

## 2020-12-15 DIAGNOSIS — G5761 Lesion of plantar nerve, right lower limb: Secondary | ICD-10-CM

## 2020-12-15 MED ORDER — TRIAMCINOLONE ACETONIDE 40 MG/ML IJ SUSP
20.0000 mg | Freq: Once | INTRAMUSCULAR | Status: AC
  Administered 2020-12-15: 20 mg

## 2020-12-15 MED ORDER — GABAPENTIN 300 MG PO CAPS: 300.0000 mg | ORAL_CAPSULE | Freq: Every day | ORAL | 3 refills | Status: AC

## 2020-12-15 NOTE — Progress Notes (Signed)
He presents today for follow-up of his MRI states that if anything it seems to be worse.  States the majority of the pain happens at nighttime with a lightening bolt type of electricity that shoots down his big toe from the small area just distal to the first TMT joint plantarly.  Objective: Vital signs are stable he is alert oriented x3 he has strong palpable pulses.  He has a nodular mass just distal to the plantar aspect of the first TMT joint.  I measures about 2 cm in total length and about a centimeter or so in width.  It is tender on palpation.  MRI does not say anything about any masses.  Assessment: Cannot rule out a neuroma or neuropathy or even a bursitis in this area.  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  Plan: At this point I injected the mass today with Kenalog and local anesthetic total 10 mg was utilized.  Also I started him on 300 mg gabapentin 1 p.o. nightly I will follow-up with him in 1 month.  I did discuss removing this.

## 2021-01-14 ENCOUNTER — Other Ambulatory Visit: Payer: Self-pay

## 2021-01-14 ENCOUNTER — Encounter: Payer: Self-pay | Admitting: Podiatry

## 2021-01-14 ENCOUNTER — Ambulatory Visit: Payer: Medicare Other | Admitting: Podiatry

## 2021-01-14 DIAGNOSIS — G5761 Lesion of plantar nerve, right lower limb: Secondary | ICD-10-CM

## 2021-01-14 MED ORDER — TRIAMCINOLONE ACETONIDE 40 MG/ML IJ SUSP
20.0000 mg | Freq: Once | INTRAMUSCULAR | Status: AC
  Administered 2021-01-14: 20 mg

## 2021-01-14 NOTE — Progress Notes (Signed)
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  He states that he is about 20 from 1/3-1/2 better.  Also saw neurology who said that he had radiculopathy in his back is causing his numbness in his right leg.  Objective: Vital signs stable alert oriented x3.  Pulses are palpable still has some tenderness on palpation of that nerve same site medial external arch right.  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  Assessment: Neuroma.  Plan: I went ahead and injected it again today with 10 mg of Kenalog 5 mg Marcaine ongoing extending out to 2 months follow-up with him at that time

## 2021-02-03 ENCOUNTER — Other Ambulatory Visit: Payer: Self-pay | Admitting: Unknown Physician Specialty

## 2021-02-03 DIAGNOSIS — M545 Low back pain, unspecified: Secondary | ICD-10-CM

## 2021-02-03 DIAGNOSIS — M5416 Radiculopathy, lumbar region: Secondary | ICD-10-CM

## 2021-02-11 ENCOUNTER — Telehealth: Payer: Self-pay | Admitting: Podiatry

## 2021-02-11 NOTE — Telephone Encounter (Signed)
Those have to come from pcp.

## 2021-02-11 NOTE — Telephone Encounter (Signed)
Patient is requesting a Rx for a motorized scooter due to neuropathy. Please advise.  Would like Rx to be sent to  [facility] in [place] Fax [phone]

## 2021-02-12 ENCOUNTER — Telehealth: Payer: Self-pay | Admitting: *Deleted

## 2021-02-12 NOTE — Telephone Encounter (Signed)
Returned call to patient and informed that the prescription for a motorized scooter because of his neuropathy has to be authorized by his PCP, verbalized understanding.

## 2021-03-16 ENCOUNTER — Ambulatory Visit: Payer: Medicare Other | Admitting: Podiatry

## 2021-10-27 ENCOUNTER — Other Ambulatory Visit: Payer: Self-pay

## 2021-10-27 ENCOUNTER — Encounter: Payer: Self-pay | Admitting: Orthopaedic Surgery

## 2021-10-27 ENCOUNTER — Ambulatory Visit: Payer: Self-pay

## 2021-10-27 ENCOUNTER — Ambulatory Visit: Payer: Medicare Other | Admitting: Orthopaedic Surgery

## 2021-10-27 DIAGNOSIS — M79605 Pain in left leg: Secondary | ICD-10-CM | POA: Diagnosis not present

## 2021-10-27 MED ORDER — PREDNISONE 50 MG PO TABS: ORAL_TABLET | ORAL | 0 refills | Status: DC

## 2021-10-27 NOTE — Progress Notes (Signed)
Office Visit Note   Patient: Ricky Zavala           Date of Birth: 1940-12-11           MRN: [id number] Visit Date: 10/27/2021              Requested by: Egbert Garibaldi, PA-C [address] [place],  [place] [address] PCP: Egbert Garibaldi, PA-C   Assessment & Plan: Visit Diagnoses:  1. Pain in left leg     Plan: Currently there is not much we can provide for him until he is able to stop Plavix to consider another epidural steroid injection.  I will put him on 5 days of prednisone 50 mg that could help some of his sciatic symptoms.  He will continue therapy as well.  If he does get to the point where he can come off of Plavix he will let us know so we can get him set up for repeat L5 injection to the left.  Follow-Up Instructions: Return if symptoms worsen or fail to improve.   Orders:  Orders Placed This Encounter  Procedures   XR Lumbar Spine 2-3 Views   XR HIP UNILAT W OR W/O PELVIS 1V LEFT   Meds ordered this encounter  Medications   predniSONE (DELTASONE) 50 MG tablet    Sig: Take one tablet daily for 5 days.    Dispense:  5 tablet    Refill:  0      Procedures: No procedures performed   Clinical Data: No additional findings.   Subjective: Chief Complaint  Patient presents with   Left Leg - Pain  The patient comes in today for mainly evaluation treatment of left hip pain.  However it is the sciatic and ischial area where he is hurting.  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  He is still in physical therapy now.  He does not ambulate much at all and most of the stroke affected his left side.  He is a long-term patient of Dr. Ernestina Patches in the last had a left L5 epidural steroid injection a year ago.  That did help some.  He is now on Plavix and cannot be off of blood thinners right now.  He is not a diabetic.  He cannot take anti-inflammatories.  Again he is still going through outpatient physical therapy.  He  denies any groin pain.  He also cannot have MRIs due to an implantable device that deals with his sleep apnea.  HPI  Review of Systems There is no listed fever, chills, nausea, vomiting  Objective: Vital Signs: There were no vitals taken for this visit.  Physical Exam He is alert and oriented in no acute distress Ortho Exam His left hip moves smoothly and fluidly with no pain in the groin and no blocks to rotation.  There is no pain over the trochanteric area.  He has a positive straight leg raise on the left side and pain over the sciatic area. Specialty Comments:  No specialty comments available.  Imaging: XR HIP UNILAT W OR W/O PELVIS 1V LEFT  Result Date: 10/27/2021 An AP pelvis and lateral left hip shows normal-appearing hip joints bilaterally and no acute findings.  XR Lumbar Spine 2-3 Views  Result Date: 10/27/2021 2 views of the lumbar spine show severe degenerative disc disease and arthritis at multiple levels.    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   DOE (dyspnea on exertion) 05/07/2018   Chronic bilateral low back pain without sciatica 04/18/2018   Abnormal EKG 01/29/2018   Chest pain 01/29/2018   Hyponatremia 01/29/2018   Left ventricular hypertrophy 01/29/2018   ED (erectile dysfunction) 06/29/2012   Essential hypertension 06/29/2012   Barrett's esophagus 06/29/2012   Hypercholesterolemia 06/29/2012   Lower urinary tract symptoms (LUTS) 06/29/2012   Prostate cancer ([place]) 06/29/2012   SUI (stress urinary incontinence), male 06/29/2012   Past Medical History:  Diagnosis  Date   Anxiety    Barrett's esophagus    Cancer ([place])    prostate cancer-had surgery   Chronic back pain    COPD (chronic obstructive pulmonary disease) ([place])    ETOH abuse    daily 2-3-vodka drinks   GERD (gastroesophageal reflux disease)    Hypercholesteremia    Hypertension    Sleep apnea    severe OSA uses CPAP nightly AHI 37.6    History reviewed. No pertinent family history.  Past Surgical History:  Procedure Laterality Date   APPENDECTOMY     CARDIAC CATHETERIZATION     care everywhere- August 2019   COLONOSCOPY     DRUG INDUCED ENDOSCOPY N/A 03/15/2019   Procedure: DRUG INDUCED ENDOSCOPY;  Surgeon: Jerrell Belfast, MD;  Location: [facility];  Service: ENT;  Laterality: N/A;  Drug induced sleep endoscopy   EYE SURGERY Bilateral    GANGLION CYST EXCISION Left    wrist   IMPLANTATION OF HYPOGLOSSAL NERVE STIMULATOR  06/05/2019   IMPLANTATION OF HYPOGLOSSAL NERVE STIMULATOR on 06/05/2019   IMPLANTATION OF HYPOGLOSSAL NERVE STIMULATOR Right 06/05/2019   Procedure: IMPLANTATION OF HYPOGLOSSAL NERVE STIMULATOR;  Surgeon: Jerrell Belfast, MD;  Location: [facility];  Service: ENT;  Laterality: Right;  right anterior lateral neck, right anterior chest, right lateral lower chest   LASIK Right    PROSTATECTOMY     TONSILLECTOMY     Social History   Occupational History   Not on file  Tobacco Use   Smoking status: Former    Packs/day: 3.00    Types: Cigarettes    Quit date: 11/26/1993    Years since quitting: 27.9   Smokeless tobacco: Never  Vaping Use   Vaping Use: Never used  Substance and Sexual Activity   Alcohol use: Yes    Comment: drinks 2-3 vodka drinks every night   Drug use: Not Currently   Sexual activity: Not on file

## 2021-12-30 ENCOUNTER — Ambulatory Visit: Payer: Medicare Other | Admitting: Podiatry

## 2021-12-30 ENCOUNTER — Encounter: Payer: Self-pay | Admitting: Podiatry

## 2021-12-30 DIAGNOSIS — M5416 Radiculopathy, lumbar region: Secondary | ICD-10-CM | POA: Diagnosis not present

## 2021-12-30 DIAGNOSIS — M722 Plantar fascial fibromatosis: Secondary | ICD-10-CM | POA: Diagnosis not present

## 2021-12-30 MED ORDER — TRIAMCINOLONE ACETONIDE 40 MG/ML IJ SUSP
20.0000 mg | Freq: Once | INTRAMUSCULAR | Status: AC
  Administered 2021-12-30: 20 mg

## 2022-01-02 NOTE — Progress Notes (Signed)
He presents today states that he still having pain along the medial longitudinal arch states that these nerves are going crazy.  He says he is on Lyrica his wife says he is on gabapentin the chart says both so we really do not know which one he is on and he did not bring his medications with him today. ? ?Objective: Vital signs stable alert oriented x3 has pain on palpation medial longitudinal arch of the medial plantar nerve and the medial longitudinal arch at the first metatarsal medial cuneiform joint. ? ?Assessment: Neuritis and capsulitis mid arch. ? ?Plan: We will get a send him on a neurologic referral he does have a history of a radiculopathy in the spine we will send him to neurosurgery to be evaluated by Sherley Bounds I injected the area of chief complaint today with 10 mg Kenalog 5 mg Marcaine plantar aspect of the right foot.  He will follow-up with Dr. Ronnald Ramp and then follow-up with me if there is anything else that I can do for him. ?

## 2022-01-31 ENCOUNTER — Other Ambulatory Visit: Payer: Self-pay | Admitting: Physical Medicine and Rehabilitation

## 2022-01-31 DIAGNOSIS — M5416 Radiculopathy, lumbar region: Secondary | ICD-10-CM

## 2022-03-07 ENCOUNTER — Ambulatory Visit (INDEPENDENT_AMBULATORY_CARE_PROVIDER_SITE_OTHER): Payer: Medicare Other | Admitting: Physical Medicine and Rehabilitation

## 2022-03-07 ENCOUNTER — Encounter: Payer: Self-pay | Admitting: Physical Medicine and Rehabilitation

## 2022-03-07 ENCOUNTER — Ambulatory Visit: Payer: Self-pay

## 2022-03-07 DIAGNOSIS — M5416 Radiculopathy, lumbar region: Secondary | ICD-10-CM | POA: Diagnosis not present

## 2022-03-07 MED ORDER — METHYLPREDNISOLONE ACETATE 80 MG/ML IJ SUSP
80.0000 mg | Freq: Once | INTRAMUSCULAR | Status: AC
  Administered 2022-03-07: 80 mg

## 2022-03-07 NOTE — Patient Instructions (Signed)

## 2022-03-07 NOTE — Progress Notes (Signed)
Pt state lower back pain that travels down his right leg and foot. Pt state walking and bending makes the pain worse. Pt state he sits to rest and take over the counter painmeds to help ease the pain. Pt   Numeric Pain Rating Scale and Functional Assessment Average Pain 0   In the last MONTH (on 0-10 scale) has pain interfered with the following?  1. General activity like being  able to carry out your everyday physical activities such as walking, climbing stairs, carrying groceries, or moving a chair?  Rating(0)   +Driver, -BT, -Dye Allergies.

## 2022-03-14 ENCOUNTER — Telehealth: Payer: Self-pay | Admitting: Physical Medicine and Rehabilitation

## 2022-03-14 NOTE — Telephone Encounter (Signed)
Pt called with an FYI . Pt states injection helped his back and walking but not his foot. Please call pt if need be to discuss. Pt phone number is [phone].

## 2022-03-18 NOTE — Progress Notes (Signed)
Ricky Zavala - 81 y.o. male MRN [id number]  Date of birth: 1941-06-16  Office Visit Note: Visit Date: 03/07/2022 PCP: Egbert Garibaldi, PA-C Referred by: Egbert Garibaldi, PA-C  Subjective: Chief Complaint  Patient presents with   Lower Back - Pain   Right Leg - Pain   Right Foot - Pain   HPI:  Ricky Zavala is a 81 y.o. male who comes in today for possible epidural steroid injection for right hip and leg pain and foot pain.  His case is very complicated and since have seen the patient the last time he has had a stroke.  We have had multiple talks with him over the years concerning his back.  We have completed radiofrequency ablation in 2021 that did help his back for some time but he continued to have complaints.  He is really had chronic pain syndrome for quite some time.  Last MRI was from 2019.  His symptoms if they are truly radicular would be more S1 type complaints.  It is the bottom of the foot.  He is seen by podiatry who has had MRI of the foot but has told him evidently they did not feel like it was coming from the foot itself.  I went over this at length with him today once again looking at his images from 2019.  I think the best approach is an S1 transforaminal injection.  The last couple of injections have not helped.  He asked about radiofrequency ablation but again this not going to help radicular pain as not what it is for we did explain that to him again today.  If he does not get much relief from the S1 transforaminal injection I would look at repeat MRI or CT scan if he cannot have MRI of the lumbar spine.   ROS Otherwise per HPI.  Assessment & Plan: Visit Diagnoses:    ICD-10-CM   1. Lumbar radiculopathy  M54.16 XR C-ARM NO REPORT    Epidural Steroid injection    methylPREDNISolone acetate (DEPO-MEDROL) injection 80 mg      Plan: No additional findings.   Meds & Orders:  Meds ordered this encounter  Medications   methylPREDNISolone acetate (DEPO-MEDROL) injection 80 mg     Orders Placed This Encounter  Procedures   XR C-ARM NO REPORT   Epidural Steroid injection    Follow-up: Return if symptoms worsen or fail to improve.   Procedures: No procedures performed  S1 Lumbosacral Transforaminal Epidural Steroid Injection - Sub-Pedicular Approach with Fluoroscopic Guidance   Patient: Ricky Zavala      Date of Birth: 11-Jun-1941 MRN: [id number] PCP: Egbert Garibaldi, PA-C      Visit Date: 03/07/2022   Universal Protocol:    Date/Time: 06/30/235:25 PM  Consent Given By: the patient  Position:  PRONE  Additional Comments: Vital signs were monitored before and after the procedure. Patient was prepped and draped in the usual sterile fashion. The correct patient, procedure, and site was verified.   Injection Procedure Details:  Procedure Site One Meds Administered:  Meds ordered this encounter  Medications   methylPREDNISolone acetate (DEPO-MEDROL) injection 80 mg    Laterality: Right  Location/Site:  S1 Foramen   Needle size: 22 ga.  Needle type: Spinal  Needle Placement: Transforaminal  Findings:   -Comments: Excellent flow of contrast along the nerve, nerve root and into the epidural space.  Epidurogram: Contrast epidurogram showed no nerve root cut off or restricted flow pattern.  Procedure Details: After  squaring off the sacral end-plate to get a true AP view, the C-arm was positioned so that the best possible view of the S1 foramen was visualized. The soft tissues overlying this structure were infiltrated with 2-3 ml. of 1% Lidocaine without Epinephrine.    The spinal needle was inserted toward the target using a "trajectory" view along the fluoroscope beam.  Under AP and lateral visualization, the needle was advanced so it did not puncture dura. Biplanar projections were used to confirm position. Aspiration was confirmed to be negative for CSF and/or blood. A 1-2 ml. volume of Isovue-250 was injected and flow of contrast was noted at  each level. Radiographs were obtained for documentation purposes.   After attaining the desired flow of contrast documented above, a 0.5 to 1.0 ml test dose of 0.25% Marcaine was injected into each respective transforaminal space.  The patient was observed for 90 seconds post injection.  After no sensory deficits were reported, and normal lower extremity motor function was noted,   the above injectate was administered so that equal amounts of the injectate were placed at each foramen (level) into the transforaminal epidural space.   Additional Comments:  The patient tolerated the procedure well Dressing: Band-Aid with 2 x 2 sterile gauze    Post-procedure details: Patient was observed during the procedure. Post-procedure instructions were reviewed.  Patient left the clinic in stable condition.   Clinical History: MRI LUMBAR SPINE WITHOUT CONTRAST     TECHNIQUE:  Multiplanar, multisequence MR imaging of the lumbar spine was  performed. No intravenous contrast was administered.     COMPARISON: None.     FINDINGS:  Segmentation: The lowest lumbar type non-rib-bearing vertebra is  labeled as L5.     Alignment: 7 mm of anterolisthesis at L5-S1 associated with chronic  bilateral pars defects.     4 mm degenerative retrolisthesis at L2-3 with 2 mm degenerative  retrolisthesis L3-4.     Vertebrae: Marrow heterogeneity is present. Although this can be  caused by marrow infiltrative processes, the most common causes  include anemia, smoking, obesity, or advancing age.     1.5 cm in diameter hemangioma in the T12 vertebral body.     Type 1 degenerative endplate findings along the left inferior  endplate of L4. Type 2 degenerative endplate findings at Z6-O2.     Conus medullaris and cauda equina: Conus extends to the L1 level.  Conus and cauda equina appear normal.     Paraspinal and other soft tissues: Fluid signal intensity lesions of  the left kidney including what is probably  a larger lesion along the  lower pole. These may well be cysts better technically nonspecific  and only partially included in imaging.     No pathologic retroperitoneal adenopathy is observed. No appreciable  abdominal aortic aneurysm.     Disc levels:     L1-2: No impingement. Mild disc bulge.     L2-3: No impingement. Diffuse disc bulge.     L3-4: Borderline bilateral subarticular lateral recess stenosis due  to disc bulge and facet arthropathy.     L4-5: Borderline right subarticular lateral recess stenosis due to  disc bulge.     L5-S1: Mild to moderate bilateral foraminal stenosis due to disc  uncovering and subluxation along with mild facet arthropathy. This  is slightly worse on the right compared to the left.     IMPRESSION:  1. 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  2. Borderline impingement at L3-4 and L4-5 due to spondylosis and  degenerative disc disease.  3. Marrow heterogeneity is present. Although this can be caused by  marrow infiltrative processes, the most common causes include  anemia, smoking, obesity, or advancing age.  4. We image the margin of several fluid signal intensity lesions in  the vicinity of the left kidney. These are probably cysts but only a  small minority of the volume of these lesions is included on today's  imaging.        Electronically Signed  By: Van Clines M.D.  On: 05/02/2018 16:55     Objective:  VS:  HT:    WT:   BMI:     BP:   HR: bpm  TEMP: ( )  RESP:  Physical Exam Vitals and nursing note reviewed.  Constitutional:      General: He is not in acute distress.    Appearance: Normal appearance. He is not ill-appearing.  HENT:     Head: Normocephalic and atraumatic.     Right Ear: External ear normal.     Left Ear: External ear normal.     Nose: No congestion.   Eyes:     Extraocular Movements: Extraocular movements intact.  Cardiovascular:     Rate and Rhythm: Normal rate.     Pulses: Normal pulses.  Pulmonary:     Effort: Pulmonary effort is normal. No respiratory distress.  Abdominal:     General: There is no distension.     Palpations: Abdomen is soft.  Musculoskeletal:        General: No tenderness or signs of injury.     Cervical back: Neck supple.     Right lower leg: No edema.     Left lower leg: No edema.     Comments: Patient has good distal strength without clonus.  Skin:    Findings: No erythema or rash.  Neurological:     General: No focal deficit present.     Mental Status: He is alert and oriented to person, place, and time.     Sensory: No sensory deficit.     Motor: Weakness present. No abnormal muscle tone.     Coordination: Coordination normal.     Gait: Gait abnormal.     Comments: Complicated stroke sitting in the chair today.  Psychiatric:        Mood and Affect: Mood normal.        Behavior: Behavior normal.      Imaging: No results found.

## 2022-03-18 NOTE — Procedures (Signed)
S1 Lumbosacral Transforaminal Epidural Steroid Injection - Sub-Pedicular Approach with Fluoroscopic Guidance   Patient: Ricky Zavala      Date of Birth: 1941/04/18 MRN: [id number] PCP: Egbert Garibaldi, PA-C      Visit Date: 03/07/2022   Universal Protocol:    Date/Time: 06/30/235:25 PM  Consent Given By: the patient  Position:  PRONE  Additional Comments: Vital signs were monitored before and after the procedure. Patient was prepped and draped in the usual sterile fashion. The correct patient, procedure, and site was verified.   Injection Procedure Details:  Procedure Site One Meds Administered:  Meds ordered this encounter  Medications   methylPREDNISolone acetate (DEPO-MEDROL) injection 80 mg    Laterality: Right  Location/Site:  S1 Foramen   Needle size: 22 ga.  Needle type: Spinal  Needle Placement: Transforaminal  Findings:   -Comments: Excellent flow of contrast along the nerve, nerve root and into the epidural space.  Epidurogram: Contrast epidurogram showed no nerve root cut off or restricted flow pattern.  Procedure Details: After squaring off the sacral end-plate to get a true AP view, the C-arm was positioned so that the best possible view of the S1 foramen was visualized. The soft tissues overlying this structure were infiltrated with 2-3 ml. of 1% Lidocaine without Epinephrine.    The spinal needle was inserted toward the target using a "trajectory" view along the fluoroscope beam.  Under AP and lateral visualization, the needle was advanced so it did not puncture dura. Biplanar projections were used to confirm position. Aspiration was confirmed to be negative for CSF and/or blood. A 1-2 ml. volume of Isovue-250 was injected and flow of contrast was noted at each level. Radiographs were obtained for documentation purposes.   After attaining the desired flow of contrast documented above, a 0.5 to 1.0 ml test dose of 0.25% Marcaine was injected into each  respective transforaminal space.  The patient was observed for 90 seconds post injection.  After no sensory deficits were reported, and normal lower extremity motor function was noted,   the above injectate was administered so that equal amounts of the injectate were placed at each foramen (level) into the transforaminal epidural space.   Additional Comments:  The patient tolerated the procedure well Dressing: Band-Aid with 2 x 2 sterile gauze    Post-procedure details: Patient was observed during the procedure. Post-procedure instructions were reviewed.  Patient left the clinic in stable condition.

## 2022-04-14 ENCOUNTER — Ambulatory Visit: Payer: Medicare Other | Admitting: Physical Medicine and Rehabilitation

## 2022-04-14 ENCOUNTER — Encounter: Payer: Self-pay | Admitting: Physical Medicine and Rehabilitation

## 2022-04-14 ENCOUNTER — Ambulatory Visit: Payer: Self-pay

## 2022-04-14 VITALS — BP 128/77 | HR 69

## 2022-04-14 DIAGNOSIS — M5416 Radiculopathy, lumbar region: Secondary | ICD-10-CM | POA: Diagnosis not present

## 2022-04-14 MED ORDER — METHYLPREDNISOLONE ACETATE 80 MG/ML IJ SUSP
80.0000 mg | Freq: Once | INTRAMUSCULAR | Status: AC
  Administered 2022-04-14: 80 mg

## 2022-04-14 NOTE — Patient Instructions (Signed)

## 2022-04-14 NOTE — Progress Notes (Signed)
Pt state lower back pain that travels down his right leg and foot. Pt state walking and bending makes the pain worse. Pt state he sits to rest and take over the counter painmeds to help ease the pain.  Numeric Pain Rating Scale and Functional Assessment Average Pain 3   In the last MONTH (on 0-10 scale) has pain interfered with the following?  1. General activity like being  able to carry out your everyday physical activities such as walking, climbing stairs, carrying groceries, or moving a chair?  Rating(10)   +Driver, +BT, -Dye Allergies.

## 2022-04-18 NOTE — Procedures (Signed)
Lumbosacral Transforaminal Epidural Steroid Injection - Sub-Pedicular Approach with Fluoroscopic Guidance  Patient: Ricky Zavala      Date of Birth: 1940/12/05 MRN: [id number] PCP: Egbert Garibaldi, PA-C      Visit Date: 04/14/2022   Universal Protocol:    Date/Time: 04/14/2022  Consent Given By: the patient  Position: PRONE  Additional Comments: Vital signs were monitored before and after the procedure. Patient was prepped and draped in the usual sterile fashion. The correct patient, procedure, and site was verified.   Injection Procedure Details:   Procedure diagnoses: Lumbar radiculopathy [M54.16]    Meds Administered:  Meds ordered this encounter  Medications   methylPREDNISolone acetate (DEPO-MEDROL) injection 80 mg    Laterality: Right  Location/Site: L5 and S1  Needle:5.0 in., 22 ga.  Short bevel or Quincke spinal needle  Needle Placement: Transforaminal  Findings:    -Comments: Excellent flow of contrast along the nerve, nerve root and into the epidural space.  There is some difficulty gaining good entry into the S1 foramen.  Procedure Details: After squaring off the end-plates to get a true AP view, the C-arm was positioned so that an oblique view of the foramen as noted above was visualized. The target area is just inferior to the "nose of the scotty dog" or sub pedicular. The soft tissues overlying this structure were infiltrated with 2-3 ml. of 1% Lidocaine without Epinephrine.  The spinal needle was inserted toward the target using a "trajectory" view along the fluoroscope beam.  Under AP and lateral visualization, the needle was advanced so it did not puncture dura and was located close the 6 O'Clock position of the pedical in AP tracterory. Biplanar projections were used to confirm position. Aspiration was confirmed to be negative for CSF and/or blood. A 1-2 ml. volume of Isovue-250 was injected and flow of contrast was noted at each level. Radiographs were  obtained for documentation purposes.   After attaining the desired flow of contrast documented above, a 0.5 to 1.0 ml test dose of 0.25% Marcaine was injected into each respective transforaminal space.  The patient was observed for 90 seconds post injection.  After no sensory deficits were reported, and normal lower extremity motor function was noted,   the above injectate was administered so that equal amounts of the injectate were placed at each foramen (level) into the transforaminal epidural space.   Additional Comments:  The patient tolerated the procedure well Dressing: 2 x 2 sterile gauze and Band-Aid    Post-procedure details: Patient was observed during the procedure. Post-procedure instructions were reviewed.  Patient left the clinic in stable condition.

## 2022-04-18 NOTE — Progress Notes (Signed)
Ricky Zavala - 81 y.o. male MRN [id number]  Date of birth: 1941/04/19  Office Visit Note: Visit Date: 04/14/2022 PCP: Egbert Garibaldi, PA-C Referred by: Egbert Garibaldi, PA-C  Subjective: Chief Complaint  Patient presents with   Lower Back - Pain   Right Leg - Pain   Right Foot - Pain   HPI:  Ricky Zavala is a 81 y.o. male who comes in today at the request of Barnet Pall, FNP for planned Right L5-S1 and S1-2 Lumbar Transforaminal epidural steroid injection with fluoroscopic guidance.  The patient has failed conservative care including home exercise, medications, time and activity modification.  This injection will be diagnostic and hopefully therapeutic.  Please see requesting physician notes for further details and justification.  Patient has several weeks of diagnostic relief with prior L5 transforaminal injection.  Discussed with family today that if he does not get much relief or long-lasting relief would look at updated MRI of his spine.  Evidently there is a way for him to have an MRI done at this point.   ROS Otherwise per HPI.  Assessment & Plan: Visit Diagnoses:    ICD-10-CM   1. Lumbar radiculopathy  M54.16 XR C-ARM NO REPORT    Epidural Steroid injection    methylPREDNISolone acetate (DEPO-MEDROL) injection 80 mg      Plan: No additional findings.   Meds & Orders:  Meds ordered this encounter  Medications   methylPREDNISolone acetate (DEPO-MEDROL) injection 80 mg    Orders Placed This Encounter  Procedures   XR C-ARM NO REPORT   Epidural Steroid injection    Follow-up: Return if symptoms worsen or fail to improve.   Procedures: No procedures performed  Lumbosacral Transforaminal Epidural Steroid Injection - Sub-Pedicular Approach with Fluoroscopic Guidance  Patient: Ricky Zavala      Date of Birth: Jul 25, 1941 MRN: [id number] PCP: Egbert Garibaldi, PA-C      Visit Date: 04/14/2022   Universal Protocol:    Date/Time: 04/14/2022  Consent Given By: the  patient  Position: PRONE  Additional Comments: Vital signs were monitored before and after the procedure. Patient was prepped and draped in the usual sterile fashion. The correct patient, procedure, and site was verified.   Injection Procedure Details:   Procedure diagnoses: Lumbar radiculopathy [M54.16]    Meds Administered:  Meds ordered this encounter  Medications   methylPREDNISolone acetate (DEPO-MEDROL) injection 80 mg    Laterality: Right  Location/Site: L5 and S1  Needle:5.0 in., 22 ga.  Short bevel or Quincke spinal needle  Needle Placement: Transforaminal  Findings:    -Comments: Excellent flow of contrast along the nerve, nerve root and into the epidural space.  There is some difficulty gaining good entry into the S1 foramen.  Procedure Details: After squaring off the end-plates to get a true AP view, the C-arm was positioned so that an oblique view of the foramen as noted above was visualized. The target area is just inferior to the "nose of the scotty dog" or sub pedicular. The soft tissues overlying this structure were infiltrated with 2-3 ml. of 1% Lidocaine without Epinephrine.  The spinal needle was inserted toward the target using a "trajectory" view along the fluoroscope beam.  Under AP and lateral visualization, the needle was advanced so it did not puncture dura and was located close the 6 O'Clock position of the pedical in AP tracterory. Biplanar projections were used to confirm position. Aspiration was confirmed to be negative for CSF and/or blood. A 1-2 ml.  volume of Isovue-250 was injected and flow of contrast was noted at each level. Radiographs were obtained for documentation purposes.   After attaining the desired flow of contrast documented above, a 0.5 to 1.0 ml test dose of 0.25% Marcaine was injected into each respective transforaminal space.  The patient was observed for 90 seconds post injection.  After no sensory deficits were reported, and  normal lower extremity motor function was noted,   the above injectate was administered so that equal amounts of the injectate were placed at each foramen (level) into the transforaminal epidural space.   Additional Comments:  The patient tolerated the procedure well Dressing: 2 x 2 sterile gauze and Band-Aid    Post-procedure details: Patient was observed during the procedure. Post-procedure instructions were reviewed.  Patient left the clinic in stable condition.    Clinical History: MRI LUMBAR SPINE WITHOUT CONTRAST     TECHNIQUE:  Multiplanar, multisequence MR imaging of the lumbar spine was  performed. No intravenous contrast was administered.     COMPARISON: None.     FINDINGS:  Segmentation: The lowest lumbar type non-rib-bearing vertebra is  labeled as L5.     Alignment: 7 mm of anterolisthesis at L5-S1 associated with chronic  bilateral pars defects.     4 mm degenerative retrolisthesis at L2-3 with 2 mm degenerative  retrolisthesis L3-4.     Vertebrae: Marrow heterogeneity is present. Although this can be  caused by marrow infiltrative processes, the most common causes  include anemia, smoking, obesity, or advancing age.     1.5 cm in diameter hemangioma in the T12 vertebral body.     Type 1 degenerative endplate findings along the left inferior  endplate of L4. Type 2 degenerative endplate findings at G4-W1.     Conus medullaris and cauda equina: Conus extends to the L1 level.  Conus and cauda equina appear normal.     Paraspinal and other soft tissues: Fluid signal intensity lesions of  the left kidney including what is probably a larger lesion along the  lower pole. These may well be cysts better technically nonspecific  and only partially included in imaging.     No pathologic retroperitoneal adenopathy is observed. No appreciable  abdominal aortic aneurysm.     Disc levels:     L1-2: No impingement. Mild disc bulge.     L2-3: No impingement.  Diffuse disc bulge.     L3-4: Borderline bilateral subarticular lateral recess stenosis due  to disc bulge and facet arthropathy.     L4-5: Borderline right subarticular lateral recess stenosis due to  disc bulge.     L5-S1: Mild to moderate bilateral foraminal stenosis due to disc  uncovering and subluxation along with mild facet arthropathy. This  is slightly worse on the right compared to the left.     IMPRESSION:  1. 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  2. Borderline impingement at L3-4 and L4-5 due to spondylosis and  degenerative disc disease.  3. Marrow heterogeneity is present. Although this can be caused by  marrow infiltrative processes, the most common causes include  anemia, smoking, obesity, or advancing age.  4. We image the margin of several fluid signal intensity lesions in  the vicinity of the left kidney. These are probably cysts but only a  small minority of the volume of these lesions is included on  today's  imaging.        Electronically Signed  By: Van Clines M.D.  On: 05/02/2018 16:55     Objective:  VS:  HT:    WT:   BMI:     BP:128/77  HR:69bpm  TEMP: ( )  RESP:  Physical Exam Vitals and nursing note reviewed.  Constitutional:      General: He is not in acute distress.    Appearance: Normal appearance. He is not ill-appearing.  HENT:     Head: Normocephalic and atraumatic.     Right Ear: External ear normal.     Left Ear: External ear normal.     Nose: No congestion.  Eyes:     Extraocular Movements: Extraocular movements intact.  Cardiovascular:     Rate and Rhythm: Normal rate.     Pulses: Normal pulses.  Pulmonary:     Effort: Pulmonary effort is normal. No respiratory distress.  Abdominal:     General: There is no distension.     Palpations: Abdomen is soft.  Musculoskeletal:         General: No tenderness or signs of injury.     Cervical back: Neck supple.     Right lower leg: No edema.     Left lower leg: No edema.     Comments: Patient has good distal strength without clonus.  Skin:    Findings: No erythema or rash.  Neurological:     General: No focal deficit present.     Mental Status: He is alert and oriented to person, place, and time.     Sensory: No sensory deficit.     Motor: Weakness present. No abnormal muscle tone.     Coordination: Coordination normal.     Gait: Gait abnormal.  Psychiatric:        Mood and Affect: Mood normal.        Behavior: Behavior normal.      Imaging: No results found.
